# Patient Record
Sex: Female | Born: 1940 | Race: White | Hispanic: No | Marital: Married | Smoking: Never smoker
Health system: Southern US, Community
[De-identification: ages and names within clinical notes are randomized; demographics above are authoritative.]

## PROBLEM LIST (undated history)

## (undated) DIAGNOSIS — I10 Essential (primary) hypertension: Secondary | ICD-10-CM

## (undated) DIAGNOSIS — Z96652 Presence of left artificial knee joint: Secondary | ICD-10-CM

## (undated) DIAGNOSIS — E079 Disorder of thyroid, unspecified: Secondary | ICD-10-CM

## (undated) DIAGNOSIS — N840 Polyp of corpus uteri: Secondary | ICD-10-CM

## (undated) DIAGNOSIS — K219 Gastro-esophageal reflux disease without esophagitis: Secondary | ICD-10-CM

## (undated) DIAGNOSIS — J449 Chronic obstructive pulmonary disease, unspecified: Secondary | ICD-10-CM

## (undated) DIAGNOSIS — Z8719 Personal history of other diseases of the digestive system: Secondary | ICD-10-CM

## (undated) DIAGNOSIS — Z87898 Personal history of other specified conditions: Secondary | ICD-10-CM

## (undated) DIAGNOSIS — N289 Disorder of kidney and ureter, unspecified: Secondary | ICD-10-CM

## (undated) DIAGNOSIS — R7981 Abnormal blood-gas level: Secondary | ICD-10-CM

## (undated) DIAGNOSIS — R0600 Dyspnea, unspecified: Secondary | ICD-10-CM

## (undated) DIAGNOSIS — M1712 Unilateral primary osteoarthritis, left knee: Secondary | ICD-10-CM

## (undated) DIAGNOSIS — M1711 Unilateral primary osteoarthritis, right knee: Secondary | ICD-10-CM

## (undated) DIAGNOSIS — IMO0001 Reserved for inherently not codable concepts without codable children: Secondary | ICD-10-CM

## (undated) DIAGNOSIS — Z8489 Family history of other specified conditions: Secondary | ICD-10-CM

## (undated) DIAGNOSIS — E039 Hypothyroidism, unspecified: Secondary | ICD-10-CM

## (undated) HISTORY — PX: TONSILLECTOMY: SUR1361

## (undated) HISTORY — PX: JOINT REPLACEMENT: SHX530

## (undated) HISTORY — PX: EYE SURGERY: SHX253

## (undated) HISTORY — DX: Essential (primary) hypertension: I10

## (undated) HISTORY — DX: Dyspnea, unspecified: R06.00

## (undated) HISTORY — DX: Gastro-esophageal reflux disease without esophagitis: K21.9

## (undated) HISTORY — DX: Disorder of thyroid, unspecified: E07.9

## (undated) HISTORY — PX: TRIGGER FINGER RELEASE: SHX641

## (undated) HISTORY — DX: Polyp of corpus uteri: N84.0

## (undated) HISTORY — DX: Reserved for inherently not codable concepts without codable children: IMO0001

## (undated) HISTORY — DX: Chronic obstructive pulmonary disease, unspecified: J44.9

## (undated) HISTORY — PX: HERNIA REPAIR: SHX51

## (undated) HISTORY — DX: Morbid (severe) obesity due to excess calories: E66.01

---

## 1993-05-02 HISTORY — PX: KNEE ARTHROSCOPY: SUR90

## 1998-01-26 ENCOUNTER — Ambulatory Visit (HOSPITAL_BASED_OUTPATIENT_CLINIC_OR_DEPARTMENT_OTHER): Admission: RE | Admit: 1998-01-26 | Discharge: 1998-01-26 | Payer: Self-pay | Admitting: Orthopedic Surgery

## 1998-02-11 ENCOUNTER — Other Ambulatory Visit: Admission: RE | Admit: 1998-02-11 | Discharge: 1998-02-11 | Payer: Self-pay | Admitting: Family Medicine

## 1999-02-24 ENCOUNTER — Other Ambulatory Visit: Admission: RE | Admit: 1999-02-24 | Discharge: 1999-02-24 | Payer: Self-pay | Admitting: Family Medicine

## 1999-06-14 ENCOUNTER — Ambulatory Visit (HOSPITAL_COMMUNITY): Admission: RE | Admit: 1999-06-14 | Discharge: 1999-06-14 | Payer: Self-pay | Admitting: *Deleted

## 1999-08-20 ENCOUNTER — Encounter: Admission: RE | Admit: 1999-08-20 | Discharge: 1999-08-20 | Payer: Self-pay | Admitting: Family Medicine

## 1999-08-20 ENCOUNTER — Encounter: Payer: Self-pay | Admitting: Family Medicine

## 2000-03-31 ENCOUNTER — Other Ambulatory Visit: Admission: RE | Admit: 2000-03-31 | Discharge: 2000-03-31 | Payer: Self-pay | Admitting: *Deleted

## 2000-03-31 ENCOUNTER — Encounter (INDEPENDENT_AMBULATORY_CARE_PROVIDER_SITE_OTHER): Payer: Self-pay | Admitting: Specialist

## 2000-09-14 ENCOUNTER — Encounter: Payer: Self-pay | Admitting: Family Medicine

## 2000-09-14 ENCOUNTER — Encounter: Admission: RE | Admit: 2000-09-14 | Discharge: 2000-09-14 | Payer: Self-pay | Admitting: Family Medicine

## 2001-04-17 ENCOUNTER — Other Ambulatory Visit: Admission: RE | Admit: 2001-04-17 | Discharge: 2001-04-17 | Payer: Self-pay | Admitting: *Deleted

## 2001-09-17 ENCOUNTER — Encounter: Payer: Self-pay | Admitting: Family Medicine

## 2001-09-17 ENCOUNTER — Encounter: Admission: RE | Admit: 2001-09-17 | Discharge: 2001-09-17 | Payer: Self-pay | Admitting: Family Medicine

## 2002-06-04 ENCOUNTER — Other Ambulatory Visit: Admission: RE | Admit: 2002-06-04 | Discharge: 2002-06-04 | Payer: Self-pay | Admitting: *Deleted

## 2002-09-18 ENCOUNTER — Ambulatory Visit (HOSPITAL_COMMUNITY): Admission: RE | Admit: 2002-09-18 | Discharge: 2002-09-18 | Payer: Self-pay

## 2003-07-17 ENCOUNTER — Other Ambulatory Visit: Admission: RE | Admit: 2003-07-17 | Discharge: 2003-07-17 | Payer: Self-pay | Admitting: Obstetrics and Gynecology

## 2003-07-17 ENCOUNTER — Encounter: Admission: RE | Admit: 2003-07-17 | Discharge: 2003-07-17 | Payer: Self-pay | Admitting: Family Medicine

## 2004-07-21 ENCOUNTER — Other Ambulatory Visit: Admission: RE | Admit: 2004-07-21 | Discharge: 2004-07-21 | Payer: Self-pay | Admitting: Obstetrics and Gynecology

## 2005-01-25 ENCOUNTER — Encounter: Admission: RE | Admit: 2005-01-25 | Discharge: 2005-01-25 | Payer: Self-pay | Admitting: Family Medicine

## 2005-07-26 ENCOUNTER — Other Ambulatory Visit: Admission: RE | Admit: 2005-07-26 | Discharge: 2005-07-26 | Payer: Self-pay | Admitting: Obstetrics and Gynecology

## 2005-07-31 DIAGNOSIS — N840 Polyp of corpus uteri: Secondary | ICD-10-CM

## 2005-07-31 HISTORY — DX: Polyp of corpus uteri: N84.0

## 2005-08-25 ENCOUNTER — Ambulatory Visit (HOSPITAL_BASED_OUTPATIENT_CLINIC_OR_DEPARTMENT_OTHER): Admission: RE | Admit: 2005-08-25 | Discharge: 2005-08-25 | Payer: Self-pay | Admitting: Obstetrics and Gynecology

## 2005-08-25 ENCOUNTER — Encounter (INDEPENDENT_AMBULATORY_CARE_PROVIDER_SITE_OTHER): Payer: Self-pay | Admitting: *Deleted

## 2005-10-30 HISTORY — PX: HYSTEROSCOPY: SHX211

## 2006-03-06 ENCOUNTER — Encounter: Admission: RE | Admit: 2006-03-06 | Discharge: 2006-03-06 | Payer: Self-pay | Admitting: Obstetrics and Gynecology

## 2007-03-19 ENCOUNTER — Encounter: Admission: RE | Admit: 2007-03-19 | Discharge: 2007-03-19 | Payer: Self-pay | Admitting: Obstetrics and Gynecology

## 2007-03-27 ENCOUNTER — Other Ambulatory Visit: Admission: RE | Admit: 2007-03-27 | Discharge: 2007-03-27 | Payer: Self-pay | Admitting: Obstetrics and Gynecology

## 2008-06-02 ENCOUNTER — Encounter: Payer: Self-pay | Admitting: Obstetrics and Gynecology

## 2008-06-02 ENCOUNTER — Ambulatory Visit: Payer: Self-pay | Admitting: Obstetrics and Gynecology

## 2008-06-02 ENCOUNTER — Other Ambulatory Visit: Admission: RE | Admit: 2008-06-02 | Discharge: 2008-06-02 | Payer: Self-pay | Admitting: Obstetrics and Gynecology

## 2008-06-05 ENCOUNTER — Ambulatory Visit: Payer: Self-pay | Admitting: Obstetrics and Gynecology

## 2008-06-23 ENCOUNTER — Ambulatory Visit: Payer: Self-pay | Admitting: Obstetrics and Gynecology

## 2008-10-20 ENCOUNTER — Ambulatory Visit: Payer: Self-pay | Admitting: Obstetrics and Gynecology

## 2009-03-12 ENCOUNTER — Telehealth: Payer: Self-pay | Admitting: Gastroenterology

## 2009-08-17 ENCOUNTER — Encounter: Admission: RE | Admit: 2009-08-17 | Discharge: 2009-08-17 | Payer: Self-pay | Admitting: Obstetrics and Gynecology

## 2009-09-10 ENCOUNTER — Other Ambulatory Visit: Admission: RE | Admit: 2009-09-10 | Discharge: 2009-09-10 | Payer: Self-pay | Admitting: Obstetrics and Gynecology

## 2009-09-10 ENCOUNTER — Ambulatory Visit: Payer: Self-pay | Admitting: Obstetrics and Gynecology

## 2010-09-10 ENCOUNTER — Encounter (INDEPENDENT_AMBULATORY_CARE_PROVIDER_SITE_OTHER): Payer: Self-pay | Admitting: General Surgery

## 2010-09-17 NOTE — Op Note (Signed)
Valerie Beck, Valerie Beck                          ACCOUNT NO.:  [id number]   MEDICAL RECORD NO.:  [id number]                   PATIENT TYPE:  AMB   LOCATION:  DAY                                  FACILITY:  [facility]   PHYSICIAN:  Lorre Munroe., M.D.            DATE OF BIRTH:  May 11, 1940   DATE OF PROCEDURE:  09/18/2002  DATE OF DISCHARGE:                                 OPERATIVE REPORT   PREOPERATIVE DIAGNOSIS:  Incisional hernia.   POSTOPERATIVE DIAGNOSIS:  Incisional hernia.   OPERATION/PROCEDURE:  Repair of incisional hernia.   SURGEON:  Lebron Conners, M.D.   ANESTHESIA:  General.   DESCRIPTION OF PROCEDURE:  After the patient was monitored and anesthetized  with endotracheal intubation and routine preparation and draping of the  abdomen, I excised the upper midline incision and discarded the scar.  I  dissected down through the subcutaneous tissues until I encountered an  obvious hernia containing fat.  I freed up the normal subcutaneous fat from  the entire length of the incision and found that in total there were three  small hernia defects in the wound.  I enlarged the defects and freed up  adhesions enough to reduce the preperitoneal fat which was in them.  I hoped  to place mesh in the preperitoneal space but the dissection under the fascia  was somewhat difficult and I could not be absolutely certain that it was an  omentum which I saw rather than preperitoneal fat, so I decided that I would  not attempt reinforcement of fascia underneath it but rather on top of it.  I freed up the fascia for approximately 3-4 cm laterally on each side and a  similar distance past the ends of the incision.  I closed the defects with 3-  0 Prolene suture and then fashioned a patch of polypropylene mesh to fit the  dissected defect.  I sewed that in with a running, basting 2-0 Prolene  suture through-and-through the mesh not going out as far as the edges.  At  that point I felt the  hernia was well repaired.  I thoroughly anesthetized  the area with long-acting local anesthetic and made sure hemostasis was  good.  I closed the subcutaneous tissues with running 3-0 Vicryl after first  bringing out a closed suction drain through an inferolateral stab incision  and suturing that to the skin.  I stapled the skin.  The patient was stable  through the procedure.                                               Lorre Munroe., M.D.    WB/MEDQ  D:  09/18/2002  T:  09/18/2002  Job:  [id number]   cc:   Windle Guard, M.D.  [phone]  [address]  [place], [place] [address]  Fax: [phone]

## 2010-09-17 NOTE — Op Note (Signed)
NAMEJOLIN, Valerie Beck                ACCOUNT NO.:  [id number]   MEDICAL RECORD NO.:  [id number]          PATIENT TYPE:  AMB   LOCATION:  [facility]                         FACILITY:  [facility]   PHYSICIAN:  Daniel L. Gottsegen, M.D.DATE OF BIRTH:  [birth year]   DATE OF PROCEDURE:  08/25/2005  DATE OF DISCHARGE:                                 OPERATIVE REPORT   PREOPERATIVE DIAGNOSIS:  Endometrial cavity lesion.   POSTOPERATIVE DIAGNOSIS:  Probable endometrial polyp.   NAME OF OPERATION:  Hysteroscopy D&C with excision of endometrial polyp.   SURGEON:  Dr. Eda Paschal.   ANESTHESIA:  General.   INDICATIONS:  The patient is a 70 year old, gravida 3, para 2, AB 1 who came  to see me in the office with pelvic pain. As a result of this, an ultrasound  was done. The patient had two intramural myomas on ultrasound, but in  addition, she had a very enlarged endometrial cavity of 2+ cm with what  appeared to be a large mass. It was probably most consistent with  endometrial polyp.  The patient had not had any postmenopausal bleeding, but  it was felt that this needed to be assessed. She enters the hospital now for  the above.   FINDINGS:  External is normal. BUS is normal. Vaginal is normal.  Cervix is  clean.  Uterus is top normal size and shape with first-degree uterine  descensus.  Adnexa failed to reveal masses. At the time of hysteroscopy,  patient had a large intracavitary lesion which appeared most consistent with  an endometrial polyp.  It was attached to the anterior wall of the fundus  near the top. Once it had been completely excised, the patient had a  completely normal endometrial cavity without any other disease.   PROCEDURE:  After adequate general anesthesia, the patient was placed in the  dorsal supine position, prepped and draped in usual sterile manner.  A  single-toothed tenaculum was placed in the anterior lip of cervix, and the  cervix was dilated to #31 Pratt dilator.  Hysteroscopic resectoscope was then  introduced. It was attached to a camera for magnification. Three percent  sorbitol was used to expand the intrauterine cavity. A wire loop set at 90  degrees with appropriate cutting and coag settings for the new Bovie was  placed into the cavity, and the above was noted. Some of it could be  partially resected, but it was so large that it was difficult to get all the  way around it. As a result of this, a polyp forceps was used, and large  pieces of it were removed. Hysteroscope was then reinserted, and now, we  could completely excise the lesion without difficulty.  At the termination  of procedure, there was absolutely no bleeding.  There appeared almost be a  submucous myoma posteriorly that was sort of pushing towards the endometrial  cavity,  but it was clearly completely outside the endometrial cavity. Fluid deficit  for the entire procedure was 50 cc. Blood loss was minimal.  The patient  tolerated procedure well and left the operating room in  satisfactory  condition.  The tissue that was removed was sent to pathology for tissue  diagnosis.      Daniel L. Eda Paschal, M.D.  Electronically Signed     DLG/MEDQ  D:  08/25/2005  T:  08/25/2005  Job:  [id number]

## 2010-09-17 NOTE — Procedures (Signed)
[facility]  Patient:    Valerie Beck, Valerie Beck                       MRN: [id number] Proc. Date: 06/14/99 Adm. Date:  [id number] Attending:  Mingo Amber CC:         Hadassah Pais. Jeannetta Nap, M.D.                           Procedure Report  PROCEDURES:  Video colonoscopy.  INDICATIONS:  A 70 year old female with hematochezia.  Preprocedure hemoglobin as 13.6 however.  PREPARATION:  She was n.p.o. since midnight, having taken Phospho-Soda prep and a clear liquid diet.  The mucosa throughout was clean.  Depth of insertion: cecum.  PREPROCEDURE SEDATION:  She received a total of 100 mg of Demerol and 5 mg of Versed intravenously. In addition, she was on 2 L of nasal cannula O2.  PROCEDURE:  The Olympus video colonoscope was inserted via the rectum and advanced fairly easily all the way through the colon to the cecum.  Extraabdominal pressure was required to traverse the hepatic flexure.  Cecal landmarks were identified nd photographed.  On withdrawal, the mucosa was carefully evaluated.  The entire right colon was normal.  Within the sigmoid colon were a minor number of wide-mouthed  diverticuli.  Retroflexed view of the rectum was unremarkable.  Patient has obvious external hemorrhoids on withdrawal through the anal canal however.  She tolerated the procedure well.  Pulse, blood pressure and oximetry testing were stable throughout.  Patient was observed in recovery for 45 minutes and discharged home alert with a benign abdomen.  IMPRESSION:  Near normal colonoscopy with few left-sided diverticuli and external hemorrhoids.  PLAN:  She is to return to the care of Dr. Jeannetta Nap.  I would be glad to reevaluate if there is any need.  She is given brochures on diverticulosis and hemorrhoidal care. DD:  06/14/99 TD:  06/14/99 Job: [id number]

## 2011-08-09 ENCOUNTER — Other Ambulatory Visit: Payer: Self-pay | Admitting: Family Medicine

## 2011-08-09 DIAGNOSIS — Z1231 Encounter for screening mammogram for malignant neoplasm of breast: Secondary | ICD-10-CM

## 2011-08-18 DIAGNOSIS — N840 Polyp of corpus uteri: Secondary | ICD-10-CM | POA: Insufficient documentation

## 2011-08-25 ENCOUNTER — Ambulatory Visit
Admission: RE | Admit: 2011-08-25 | Discharge: 2011-08-25 | Disposition: A | Discharge status: Home / Self Care | Payer: Medicare Other | Source: Ambulatory Visit | Attending: Family Medicine | Admitting: Family Medicine

## 2011-08-25 DIAGNOSIS — Z1231 Encounter for screening mammogram for malignant neoplasm of breast: Secondary | ICD-10-CM

## 2011-08-29 ENCOUNTER — Encounter: Payer: Self-pay | Admitting: Obstetrics and Gynecology

## 2011-08-29 ENCOUNTER — Ambulatory Visit (INDEPENDENT_AMBULATORY_CARE_PROVIDER_SITE_OTHER): Payer: Medicare Other | Admitting: Obstetrics and Gynecology

## 2011-08-29 VITALS — BP 120/80 | Ht 62.5 in | Wt 240.0 lb

## 2011-08-29 DIAGNOSIS — E079 Disorder of thyroid, unspecified: Secondary | ICD-10-CM | POA: Insufficient documentation

## 2011-08-29 DIAGNOSIS — R350 Frequency of micturition: Secondary | ICD-10-CM

## 2011-08-29 DIAGNOSIS — N393 Stress incontinence (female) (male): Secondary | ICD-10-CM

## 2011-08-29 DIAGNOSIS — N952 Postmenopausal atrophic vaginitis: Secondary | ICD-10-CM

## 2011-08-29 DIAGNOSIS — I10 Essential (primary) hypertension: Secondary | ICD-10-CM | POA: Insufficient documentation

## 2011-08-29 DIAGNOSIS — R3915 Urgency of urination: Secondary | ICD-10-CM

## 2011-08-29 NOTE — Progress Notes (Signed)
Patient came back to see me today for further followup. She is having urgency and stress incontinence. She also gets nocturia. She has no dysuria or hematuria. She is having no vaginal bleeding. She does have atrophic vaginitis but is asymptomatic. She just had her mammogram which was normal. She is having no pelvic pain. She has had 2 normal bone densities.she does her lab through PCP.  ROS: 12 system review done. Pertinent positives above. Other positives include hyperlipidemia, hypertension, GERD, and hypothyroidism.  Physical examination: Kennon Portela present. HEENT within normal limits. Neck: Thyroid not large. No masses. Supraclavicular nodes: not enlarged. Breasts: Examined in both sitting and lying  position. No skin changes and no masses. Abdomen: Soft no guarding rebound or masses or hernia. Pelvic: External: Within normal limits. BUS: Within normal limits. Vaginal:within normal limits. Poor  estrogen effect. No evidence of cystocele rectocele or enterocele. Cervix: clean. Uterus: Normal size and shape. Adnexa: No masses. Rectovaginal exam: Confirmatory and negative. Extremities: Within normal limits.  Assessment: #1. Urinary frequency #2. Urinary urgency #3. Atrophic vaginitis #4. 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 She will do Kegel exercises. She will call when she's ready for surgery. She will continue yearly mammograms.

## 2011-08-30 LAB — URINALYSIS W MICROSCOPIC + REFLEX CULTURE
Bacteria, UA: NONE SEEN
Crystals: NONE SEEN
Glucose, UA: NEGATIVE mg/dL
Specific Gravity, Urine: 1.008 (ref 1.005–1.030)
pH: 6 (ref 5.0–8.0)

## 2012-09-21 ENCOUNTER — Other Ambulatory Visit (HOSPITAL_COMMUNITY): Payer: Self-pay | Admitting: Family Medicine

## 2012-09-21 DIAGNOSIS — M79609 Pain in unspecified limb: Secondary | ICD-10-CM

## 2012-09-28 ENCOUNTER — Ambulatory Visit (HOSPITAL_COMMUNITY)
Admission: RE | Admit: 2012-09-28 | Discharge: 2012-09-28 | Disposition: A | Discharge status: Home / Self Care | Payer: Medicare Other | Source: Ambulatory Visit | Attending: Cardiovascular Disease | Admitting: Cardiovascular Disease

## 2012-09-28 DIAGNOSIS — I70219 Atherosclerosis of native arteries of extremities with intermittent claudication, unspecified extremity: Secondary | ICD-10-CM

## 2012-09-28 DIAGNOSIS — M79609 Pain in unspecified limb: Secondary | ICD-10-CM | POA: Insufficient documentation

## 2012-09-28 NOTE — Progress Notes (Signed)
ABI Completed. Normal. Valerie Beck

## 2013-01-29 ENCOUNTER — Other Ambulatory Visit: Payer: Self-pay | Admitting: Family Medicine

## 2013-01-29 DIAGNOSIS — R221 Localized swelling, mass and lump, neck: Secondary | ICD-10-CM

## 2013-01-30 ENCOUNTER — Other Ambulatory Visit: Payer: Medicare Other

## 2013-01-31 ENCOUNTER — Ambulatory Visit
Admission: RE | Admit: 2013-01-31 | Discharge: 2013-01-31 | Disposition: A | Discharge status: Home / Self Care | Payer: Medicare Other | Source: Ambulatory Visit | Attending: Family Medicine | Admitting: Family Medicine

## 2013-01-31 DIAGNOSIS — R221 Localized swelling, mass and lump, neck: Secondary | ICD-10-CM

## 2013-02-11 ENCOUNTER — Ambulatory Visit (HOSPITAL_COMMUNITY): Payer: Medicare Other | Attending: Family Medicine | Admitting: Cardiology

## 2013-02-11 ENCOUNTER — Other Ambulatory Visit (HOSPITAL_COMMUNITY): Payer: Self-pay | Admitting: Family Medicine

## 2013-02-11 DIAGNOSIS — R0609 Other forms of dyspnea: Secondary | ICD-10-CM | POA: Insufficient documentation

## 2013-02-11 DIAGNOSIS — R0989 Other specified symptoms and signs involving the circulatory and respiratory systems: Secondary | ICD-10-CM | POA: Insufficient documentation

## 2013-02-11 NOTE — Progress Notes (Signed)
Echo performed. 

## 2013-03-25 ENCOUNTER — Encounter (INDEPENDENT_AMBULATORY_CARE_PROVIDER_SITE_OTHER): Payer: Self-pay

## 2013-03-25 ENCOUNTER — Ambulatory Visit (INDEPENDENT_AMBULATORY_CARE_PROVIDER_SITE_OTHER): Payer: Medicare Other | Admitting: Cardiology

## 2013-03-25 ENCOUNTER — Encounter: Payer: Self-pay | Admitting: Cardiology

## 2013-03-25 VITALS — BP 144/86 | HR 66 | Ht 62.5 in | Wt 248.0 lb

## 2013-03-25 DIAGNOSIS — R0609 Other forms of dyspnea: Secondary | ICD-10-CM

## 2013-03-25 DIAGNOSIS — R0600 Dyspnea, unspecified: Secondary | ICD-10-CM

## 2013-03-25 DIAGNOSIS — I10 Essential (primary) hypertension: Secondary | ICD-10-CM

## 2013-03-25 HISTORY — DX: Dyspnea, unspecified: R06.00

## 2013-03-25 HISTORY — DX: Morbid (severe) obesity due to excess calories: E66.01

## 2013-03-25 NOTE — Progress Notes (Signed)
Valerie Beck Date of Birth: March 21, 1941 Medical Record #[id number]  History of Present Illness: Valerie Beck to is seen at the request of Dr. Jeannetta Nap for evaluation of dyspnea. She is a pleasant 72 year old white female with history of hypertension, hyperlipidemia, and morbid obesity. Her major complaint is of shortness of breath with exertion. She states she can no longer do much of anything without getting out of breath. Sometimes she'll have symptoms of breathlessness at rest. She did note some increased ankle swelling left greater than right. Her Lasix dose was doubled and this has helped. She reports that her weight has steadily been increasing over the past year despite no change in her diet. She denies any significant chest pain. She was evaluated by myself many years ago but cannot recall what that evaluation in detail. I have no records currently. She denies any significant cough but sometimes feels a knot in her throat. She did have recent thyroid ultrasound which was unremarkable. According to notes from Dr. Jeannetta Nap her chest x-ray showed cardiac enlargement without other acute changes. BNP level was 278. Hemoglobin was 12.4. Creatinine 1.26. TSH 2.99. Oxygen saturations have been normal.  Current Outpatient Prescriptions on File Prior to Visit  Medication Sig Dispense Refill  . aspirin 325 MG tablet Take 325 mg by mouth daily.      Marland Kitchen levothyroxine (SYNTHROID, LEVOTHROID) 88 MCG tablet Take 88 mcg by mouth as directed.       . loratadine (CLARITIN) 10 MG tablet Take 10 mg by mouth daily.      . metoprolol (LOPRESSOR) 50 MG tablet Take 100 mg by mouth every evening.       Marland Kitchen omeprazole (PRILOSEC) 20 MG capsule Take 20 mg by mouth daily.      . pravastatin (PRAVACHOL) 40 MG tablet Take 40 mg by mouth daily.       No current facility-administered medications on file prior to visit.    Allergies  Allergen Reactions  . Penicillins     Past Medical History  Diagnosis Date  . Endometrial  polyp 07/2005    HYSTEROSCOPY, D&C  . Hypertension   . Reflux   . Thyroid disease     Hypothyroid  . Urinary incontinence   . Dyspnea 03/25/2013  . Morbid obesity 03/25/2013    Past Surgical History  Procedure Laterality Date  . Hernia repair      X 2  . Trigger finger release      LEFT THEN RIGHT IN 2012  . Knee arthroscopy  1995  . Hysteroscopy  10/2005    D&C, HYSTEROSCOPY FOR ENDOMETRIAL POLYPS.    History  Smoking status  . Never Smoker   Smokeless tobacco  . Not on file    History  Alcohol Use No    Family History  Problem Relation Age of Onset  . Cancer Father     MELANOMA AND THYROID CANCER  . Diabetes Father   . Hypertension Father   . Heart disease Father   . Diabetes Sister   . Hypertension Mother   . Hypertension Brother   . Heart disease Brother   . Cancer Brother     Lymphoma    Review of Systems: The review of systems is positive for morbid obesity. She denies any difficulty sleeping and has no history of snoring. She does not have significant fatigue. she does feel occasional skipped beats in her heart. All other systems were reviewed and are negative.  Physical Exam: BP 144/86  Pulse  66  Ht 5' 2.5" (1.588 m)  Wt 248 lb (112.492 kg)  BMI 44.61 kg/m2 She is a morbidly obese white female in no acute distress. HEENT: Normocephalic, atraumatic. Pupils are equal round and reactive to light and accommodation. Oropharynx is clear. Neck: No adenopathy, thyromegaly, bruits, or JVD. Lungs: Clear Chest: Pendulous breasts. Cardiovascular: Regular rate and rhythm. Normal S1 and S2. No gallop, murmur, or click. Abdomen: Soft, obese, and nontender. No masses or hepatosplenomegaly. Extremities: No cyanosis or edema. Pulses are 2+ and symmetric. Skin: Warm and dry Neuro: Alert and oriented x3. Cranial nerves II through XII are intact.  LABORATORY DATA: ECG today demonstrates normal sinus rhythm with occasional PACs. It is otherwise  normal.  Echo:Study Conclusions  - Left ventricle: The cavity size was normal. Wall thickness was normal. Systolic function was normal. The estimated ejection fraction was in the range of 55% to 65%. Wall motion was normal; there were no regional wall motion abnormalities. - Left atrium: The atrium was mildly dilated. - Pulmonary arteries: Systolic pressure was mildly increased. PA peak pressure: 41mm Hg (S).   Assessment / Plan: 1. Dyspnea on exertion. I think her symptoms are most likely multifactorial. I think she has a component of diastolic heart failure which improved with increased diuretic dose. I think she also has a component of restrictive lung disease related to her morbid obesity. She also is deconditioned.she has mild pulmonary hypertension. I would like to make sure she is not having coronary ischemia presenting in an atypical fashion. I'll schedule her for a lexiscan Myoview study. I will also obtain pulmonary function studies. Her history does not suggest obstructive sleep apnea but she is at increased risk.We will continue with her higher dose of Lasix. I think that she would need to lose a significant amount of weight and will need regular aerobic exercise. We discussed weight loss strategies. 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  2. Morbid obesity.  3. Hypertension-controlled.  4. Hyperlipidemia on statin therapy.

## 2013-03-25 NOTE — Patient Instructions (Signed)
We will schedule you for a nuclear stress test and pulmonary function study  Continue your current therapy  Focus on weight loss.

## 2013-04-11 ENCOUNTER — Ambulatory Visit (HOSPITAL_COMMUNITY): Payer: Medicare Other | Attending: Cardiology | Admitting: Radiology

## 2013-04-11 VITALS — BP 138/66 | Ht 62.0 in | Wt 250.0 lb

## 2013-04-11 DIAGNOSIS — R0609 Other forms of dyspnea: Secondary | ICD-10-CM | POA: Insufficient documentation

## 2013-04-11 DIAGNOSIS — R0602 Shortness of breath: Secondary | ICD-10-CM

## 2013-04-11 DIAGNOSIS — Z8249 Family history of ischemic heart disease and other diseases of the circulatory system: Secondary | ICD-10-CM | POA: Insufficient documentation

## 2013-04-11 DIAGNOSIS — I10 Essential (primary) hypertension: Secondary | ICD-10-CM

## 2013-04-11 DIAGNOSIS — R0600 Dyspnea, unspecified: Secondary | ICD-10-CM

## 2013-04-11 DIAGNOSIS — E785 Hyperlipidemia, unspecified: Secondary | ICD-10-CM | POA: Insufficient documentation

## 2013-04-11 DIAGNOSIS — R0989 Other specified symptoms and signs involving the circulatory and respiratory systems: Secondary | ICD-10-CM | POA: Insufficient documentation

## 2013-04-11 DIAGNOSIS — R002 Palpitations: Secondary | ICD-10-CM | POA: Insufficient documentation

## 2013-04-11 MED ORDER — TECHNETIUM TC 99M SESTAMIBI GENERIC - CARDIOLITE
33.0000 | Freq: Once | INTRAVENOUS | Status: AC | PRN
  Administered 2013-04-11: 33 via INTRAVENOUS

## 2013-04-11 MED ORDER — REGADENOSON 0.4 MG/5ML IV SOLN
0.4000 mg | Freq: Once | INTRAVENOUS | Status: AC
  Administered 2013-04-11: 0.4 mg via INTRAVENOUS

## 2013-04-11 NOTE — Progress Notes (Signed)
[facility] SITE 3 NUCLEAR MED [address] [place], [place] [address] [phone]    Cardiology Nuclear Med Study  Valerie Beck is a 72 y.o. female     MRN : [id number]     DOB: 07-07-1940  Procedure Date: 04/11/2013  Nuclear Med Background Indication for Stress Test:  Evaluation for Ischemia History: Echo:02/11/13 EF:55-65%,[facility] Nuclear Study 09' (no report)Nml Cardiac Risk Factors: Family History - CAD, Hypertension and Lipids  Symptoms:  DOE, Palpitations and SOB   Nuclear Pre-Procedure Caffeine/Decaff Intake:  None NPO After: 7:00pm   Lungs:  clear O2 Sat: 97% on room air. IV 0.9% NS with Angio Cath:  22g  IV Site: R Antecubital  IV Started by:  Bonnita Levan, RN  Chest Size (in):  44 Cup Size: C+  Height: 5\' 2"  (1.575 m)  Weight:  250 lb (113.399 kg)  BMI:  Body mass index is 45.71 kg/(m^2). Tech Comments:  N/A    Nuclear Med Study 1 or 2 day study: 2 day  Stress Test Type:  Lexiscan  Reading MD: P. Nahser,M.D. Order Authorizing Provider:  Peter Swaziland, MD  Resting Radionuclide: Technetium 76m Sestamibi  Resting Radionuclide Dose: 33.0 mCi  On      04-15-13  Stress Radionuclide:  Technetium 42m Sestamibi  Stress Radionuclide Dose: 33.0 mCi   On        04-11-13          Stress Protocol Rest HR: 75 Stress HR: 96  Rest BP: 138/66 Stress BP: 146/67  Exercise Time (min): n/a METS: n/a   Predicted Max HR: 150 bpm % Max HR: 64 bpm Rate Pressure Product: 86578   Dose of Adenosine (mg):  n/a Dose of Lexiscan: 0.4 mg  Dose of Atropine (mg): n/a Dose of Dobutamine: n/a mcg/kg/min (at max HR)  Stress Test Technologist: Frederick Peers, EMT-P  Nuclear Technologist:  Domenic Polite, CNMT     Rest Procedure:  Myocardial perfusion imaging was performed at rest 45 minutes following the intravenous administration of Technetium 7m Sestamibi. Rest ECG: NSR - Normal EKG  Stress Procedure:  The patient received IV Lexiscan 0.4 mg over 15-seconds.  Technetium  3m Sestamibi injected at 30-seconds.  Quantitative spect images were obtained after a 45 minute delay. Stress ECG: No significant change from baseline ECG  QPS Raw Data Images:  Normal; no motion artifact; normal heart/lung ratio. Stress Images:  Normal homogeneous uptake in all areas of the myocardium. Rest Images:  Normal homogeneous uptake in all areas of the myocardium. Subtraction (SDS):  No evidence of ischemia. Transient Ischemic Dilatation (Normal <1.22):  0.86 Lung/Heart Ratio (Normal <0.45):  0.27 92 Quantitative Gated Spect Images QGS EDV:  NA QGS ESV:  NA  Impression Exercise Capacity:  Lexiscan with no exercise. BP Response:  Normal blood pressure response. Clinical Symptoms:  No significant symptoms noted. ECG Impression:  No significant ST segment change suggestive of ischemia. Comparison with Prior Nuclear Study: No images to compare  Overall Impression:  Normal stress nuclear study.  LV Ejection Fraction: Study not gated.  LV Wall Motion:NA   Vesta Mixer, Montez Hageman., MD, [facility] 04/15/2013, 4:58 PM Office - [phone] Pager [phone]

## 2013-04-15 ENCOUNTER — Ambulatory Visit (HOSPITAL_COMMUNITY): Payer: Medicare Other | Attending: Cardiovascular Disease

## 2013-04-15 DIAGNOSIS — R0989 Other specified symptoms and signs involving the circulatory and respiratory systems: Secondary | ICD-10-CM

## 2013-04-15 MED ORDER — TECHNETIUM TC 99M SESTAMIBI GENERIC - CARDIOLITE
33.0000 | Freq: Once | INTRAVENOUS | Status: AC | PRN
  Administered 2013-04-15: 33 via INTRAVENOUS

## 2013-04-26 ENCOUNTER — Encounter (INDEPENDENT_AMBULATORY_CARE_PROVIDER_SITE_OTHER): Payer: Self-pay

## 2013-04-26 ENCOUNTER — Ambulatory Visit (INDEPENDENT_AMBULATORY_CARE_PROVIDER_SITE_OTHER): Payer: Medicare Other | Admitting: Internal Medicine

## 2013-04-26 DIAGNOSIS — I10 Essential (primary) hypertension: Secondary | ICD-10-CM

## 2013-04-26 DIAGNOSIS — R0600 Dyspnea, unspecified: Secondary | ICD-10-CM

## 2013-04-26 DIAGNOSIS — R0602 Shortness of breath: Secondary | ICD-10-CM

## 2013-04-26 LAB — PULMONARY FUNCTION TEST
DL/VA: 4.87 ml/min/mmHg/L
DLCO unc % pred: 90 %
FEF 25-75 Pre: 1.03 L/sec
FEF2575-%Pred-Pre: 58 %
FEV1-%Change-Post: 10 %
FEV1-%Pred-Pre: 66 %
FEV1-Pre: 1.38 L
FEV1FVC-%Change-Post: 7 %
FEV1FVC-%Pred-Pre: 97 %
FEV6-%Change-Post: 4 %
FEV6-%Pred-Post: 73 %
FEV6-Post: 1.91 L
FEV6-Pre: 1.84 L
FVC-%Pred-Pre: 67 %
FVC-Post: 1.91 L
FVC-Pre: 1.86 L
Post FEV1/FVC ratio: 80 %
Pre FEV1/FVC ratio: 74 %
Pre FEV6/FVC Ratio: 99 %
RV % pred: 97 %
RV: 2.03 L

## 2013-04-26 NOTE — Progress Notes (Signed)
PFT done today. 

## 2013-05-01 ENCOUNTER — Other Ambulatory Visit: Payer: Self-pay

## 2013-05-01 MED ORDER — TIOTROPIUM BROMIDE MONOHYDRATE 18 MCG IN CAPS: 18.0000 ug | ORAL_CAPSULE | Freq: Every day | RESPIRATORY_TRACT | Status: DC

## 2014-03-03 ENCOUNTER — Encounter: Payer: Self-pay | Admitting: Cardiology

## 2014-03-25 ENCOUNTER — Other Ambulatory Visit: Payer: Self-pay

## 2014-03-25 DIAGNOSIS — Z1231 Encounter for screening mammogram for malignant neoplasm of breast: Secondary | ICD-10-CM

## 2014-04-01 ENCOUNTER — Ambulatory Visit
Admission: RE | Admit: 2014-04-01 | Discharge: 2014-04-01 | Disposition: A | Discharge status: Home / Self Care | Payer: Medicare Other | Source: Ambulatory Visit

## 2014-04-01 DIAGNOSIS — Z1231 Encounter for screening mammogram for malignant neoplasm of breast: Secondary | ICD-10-CM

## 2014-04-15 ENCOUNTER — Telehealth: Payer: Self-pay

## 2014-04-15 ENCOUNTER — Other Ambulatory Visit: Payer: Self-pay | Admitting: Gastroenterology

## 2014-04-15 NOTE — Telephone Encounter (Signed)
[facility]Dr.Jordan cleared patient for upcoming surgery.Form faxed back to fax # [phone].

## 2014-04-29 ENCOUNTER — Encounter: Payer: Self-pay | Admitting: Cardiology

## 2014-04-29 ENCOUNTER — Ambulatory Visit (INDEPENDENT_AMBULATORY_CARE_PROVIDER_SITE_OTHER): Payer: Medicare Other | Admitting: Cardiology

## 2014-04-29 ENCOUNTER — Telehealth: Payer: Self-pay

## 2014-04-29 VITALS — BP 130/70 | HR 76 | Ht 64.0 in | Wt 245.0 lb

## 2014-04-29 DIAGNOSIS — I10 Essential (primary) hypertension: Secondary | ICD-10-CM

## 2014-04-29 DIAGNOSIS — J449 Chronic obstructive pulmonary disease, unspecified: Secondary | ICD-10-CM | POA: Insufficient documentation

## 2014-04-29 DIAGNOSIS — R0600 Dyspnea, unspecified: Secondary | ICD-10-CM

## 2014-04-29 NOTE — Progress Notes (Signed)
Valerie Beck Date of Beck: 1940-07-13 Medical Record #[id number]  History of Present Illness: [name] to is seen for cardiac clearance for TKR.  She has a history of hypertension, hyperlipidemia, and morbid obesity. She was seen one year ago for evaluation of dyspnea.  Echo showed mild LAE with normal LV function. Mild pulmonary HTN. Myoview study was normal. PFTs showed moderate COPD with some response to bronchodilators. She was started on Spiriva but didn't really notice much change. She does think her dyspnea is better this year. Minimal swelling. Mild congestion in the early am. 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   Current Outpatient Prescriptions on File Prior to Visit  Medication Sig Dispense Refill  . Cholecalciferol (VITAMIN D-3 PO) Take 5,000 Units by mouth.    . cyanocobalamin 500 MCG tablet Take 500 mcg by mouth daily.    Marland Kitchen. DOCUSATE SODIUM PO Take by mouth daily.    . furosemide (LASIX) 80 MG tablet Take 80 mg by mouth daily.    . Glucosamine-Chondroitin (GLUCOSAMINE CHONDR COMPLEX PO) Take by mouth.    . metoprolol (LOPRESSOR) 50 MG tablet Take 100 mg by mouth every evening.     . Multiple Vitamin (MULTIVITAMIN) tablet Take 1 tablet by mouth daily.    Marland Kitchen. omeprazole (PRILOSEC) 20 MG capsule Take 20 mg by mouth daily.    . pravastatin (PRAVACHOL) 40 MG tablet Take 40 mg by mouth daily.    Marland Kitchen. tiotropium (SPIRIVA HANDIHALER) 18 MCG inhalation capsule Place 1 capsule (18 mcg total) into inhaler and inhale daily. 30 capsule 6   No current facility-administered medications on file prior to visit.    Allergies  Allergen Reactions  . Penicillins     Past Medical History  Diagnosis Date  . Endometrial polyp 07/2005    HYSTEROSCOPY, D&C  . Hypertension   . Reflux   . Thyroid disease     Hypothyroid  . Urinary incontinence   . Dyspnea 03/25/2013  . Morbid obesity 03/25/2013  . COPD (chronic obstructive pulmonary disease)     Past Surgical  History  Procedure Laterality Date  . Hernia repair      X 2  . Trigger finger release      LEFT THEN RIGHT IN 2012  . Knee arthroscopy  1995  . Hysteroscopy  10/2005    D&C, HYSTEROSCOPY FOR ENDOMETRIAL POLYPS.    History  Smoking status  . Never Smoker   Smokeless tobacco  . Not on file    History  Alcohol Use No    Family History  Problem Relation Age of Onset  . Cancer Father     MELANOMA AND THYROID CANCER  . Diabetes Father   . Hypertension Father   . Heart disease Father   . Diabetes Sister   . Hypertension Mother   . Hypertension Brother   . Heart disease Brother   . Cancer Brother     Lymphoma    Review of Systems: The review of systems is positive for morbid obesity. All other systems were reviewed and are negative.  Physical Exam: BP 130/70 mmHg  Pulse 76  Ht 5\' 4"  (1.626 m)  Wt 245 lb (111.131 kg)  BMI 42.03 kg/m2 She is a morbidly obese white female in no acute distress. HEENT: Normocephalic, atraumatic. Pupils are equal round and reactive to light and accommodation. Oropharynx is clear. Neck: No adenopathy, thyromegaly, bruits, or JVD. Lungs: Clear Chest: Pendulous breasts. Cardiovascular: Regular rate and rhythm.  Normal S1 and S2. No gallop, murmur, or click. Abdomen: Soft, obese, and nontender. No masses or hepatosplenomegaly. Extremities: No cyanosis or edema. Pulses are 2+ and symmetric. Skin: Warm and dry Neuro: Alert and oriented x3. Cranial nerves II through XII are intact.  LABORATORY DATA: ECG today demonstrates normal sinus rhythm with rate 76. It is  Normal. I have personally reviewed and interpreted this study.   Echo:Study Conclusions  - Left ventricle: The cavity size was normal. Wall thickness was normal. Systolic function was normal. The estimated ejection fraction was in the range of 55% to 65%. Wall motion was normal; there were no regional wall motion abnormalities. - Left atrium: The atrium was mildly dilated. -  Pulmonary arteries: Systolic pressure was mildly increased. PA peak pressure: 41mm Hg (S).  Cardiology Nuclear Med Study  Valerie Beck is a 73 y.o. female MRN : [id number] DOB: 11/03/1940  Procedure Date: 04/11/2013  Nuclear Med Background Indication for Stress Test: Evaluation for Ischemia History: Echo:02/11/13 EF:55-65%,[facility] Nuclear Study 09' (no report)Nml Cardiac Risk Factors: Family History - CAD, Hypertension and Lipids  Symptoms: DOE, Palpitations and SOB   Nuclear Pre-Procedure Caffeine/Decaff Intake: None NPO After: 7:00pm   Lungs: clear O2 Sat: 97% on room air. IV 0.9% NS with Angio Cath: 22g  IV Site: R Antecubital  IV Started by: Valerie LevanJackie Smith, RN  Chest Size (in): 44 Cup Size: C+  Height: 5\' 2"  (1.575 m)  Weight: 250 lb (113.399 kg)  BMI: Body mass index is 45.71 kg/(m^2). Tech Comments: N/A    Nuclear Med Study 1 or 2 day study: 2 day  Stress Test Type: Lexiscan  Reading MD: Valerie Beck,M.D. Order Authorizing Provider: Peter SwazilandJordan, MD  Resting Radionuclide: Technetium 2214m Sestamibi  Resting Radionuclide Dose: 33.0 mCi On  04-15-13  Stress Radionuclide: Technetium 2514m Sestamibi  Stress Radionuclide Dose: 33.0 mCi On  04-11-13     Stress Protocol Rest HR: 75 Stress HR: 96  Rest BP: 138/66 Stress BP: 146/67  Exercise Time (min): n/a METS: n/a   Predicted Max HR: 150 bpm % Max HR: 64 bpm Rate Pressure Product: [id number]   Dose of Adenosine (mg): n/a Dose of Lexiscan: 0.4 mg  Dose of Atropine (mg): n/a Dose of Dobutamine: n/a mcg/kg/min (at max HR)  Stress Test Technologist: Valerie Peerseresa Beck, EMT-P  Nuclear Technologist: Valerie Beck, CNMT     Rest Procedure: Myocardial perfusion imaging was performed at rest 45 minutes following the intravenous administration of Technetium 4414m Sestamibi. Rest ECG: NSR - Normal EKG  Stress Procedure: The patient received IV Lexiscan 0.4 mg over  15-seconds. Technetium 414m Sestamibi injected at 30-seconds. Quantitative spect images were obtained after a 45 minute delay. Stress ECG: No significant change from baseline ECG  QPS Raw Data Images: Normal; no motion artifact; normal heart/lung ratio. Stress Images: Normal homogeneous uptake in all areas of the myocardium. Rest Images: Normal homogeneous uptake in all areas of the myocardium. Subtraction (SDS): No evidence of ischemia. Transient Ischemic Dilatation (Normal <1.22): 0.86 Lung/Heart Ratio (Normal <0.45): 0.27 92 Quantitative Gated Spect Images QGS EDV: NA QGS ESV: NA  Impression Exercise Capacity: Lexiscan with no exercise. BP Response: Normal blood pressure response. Clinical Symptoms: No significant symptoms noted. ECG Impression: No significant ST segment change suggestive of ischemia. Comparison with Prior Nuclear Study: No images to compare  Overall Impression: Normal stress nuclear study.  LV Ejection Fraction: Study not gated. LV Wall Motion:NA   Valerie Beck, Valerie HagemanJr., MD, [facility] Center[facility]ACC 04/15/2013, 4:58 PM Office -  [phone] Pager [phone]  Assessment / Plan: 1. Dyspnea-chronic. This is multifactorial. She has a component of diastolic heart failure which improved with diuretics. I think she also has a component of restrictive lung disease related to her morbid obesity. She  is deconditioned. She has mild pulmonary hypertension. She has evidence of COPD by PFTs.  At this point she is stable from a cardiac view. She is cleared for TKR. If breathing worsens it may be beneficial to have pulmonary see. I will follow up prn.  2. Morbid obesity.  3. Hypertension-controlled.  4. Hyperlipidemia on statin therapy.  5. COPD with bronchodilator response.

## 2014-04-29 NOTE — Telephone Encounter (Signed)
Dr.Jordan cleared patient for upcoming knee surgery.Note faxed to Dr.Wainer's office at fax # [phone].

## 2014-04-29 NOTE — Patient Instructions (Signed)
Continue your current therapy although you can stop Spiriva.  You are clear for knee surgery.

## 2014-05-21 ENCOUNTER — Other Ambulatory Visit (HOSPITAL_COMMUNITY): Payer: Medicare Other

## 2014-05-21 ENCOUNTER — Encounter (HOSPITAL_COMMUNITY): Payer: Self-pay | Admitting: Physician Assistant

## 2014-05-21 DIAGNOSIS — M1712 Unilateral primary osteoarthritis, left knee: Secondary | ICD-10-CM | POA: Diagnosis present

## 2014-05-21 NOTE — H&P (Signed)
TOTAL KNEE ADMISSION H&P  Patient is being admitted for left total knee arthroplasty.  Subjective:  Chief Complaint:left knee pain.  HPI: Valerie Beck, 74 y.o. female, has a history of pain and functional disability in the left knee due to arthritis and has failed non-surgical conservative treatments for greater than 12 weeks to includeNSAID's and/or analgesics, corticosteriod injections, viscosupplementation injections, flexibility and strengthening excercises, supervised PT with diminished ADL's post treatment, weight reduction as appropriate and activity modification.  Onset of symptoms was gradual, starting 10 years ago with gradually worsening course since that time. The patient noted prior procedures on the knee to include  arthroscopy and menisectomy on the left knee(s).  Patient currently rates pain in the left knee(s) at 10 out of 10 with activity. Patient has night pain, worsening of pain with activity and weight bearing, pain that interferes with activities of daily living, crepitus and joint swelling.  Patient has evidence of subchondral sclerosis, joint subluxation and joint space narrowing by imaging studies.  There is no active infection.  Patient Active Problem List   Diagnosis Date Noted  . Primary localized osteoarthritis of left knee   . COPD (chronic obstructive pulmonary disease)   . Dyspnea 03/25/2013  . Morbid obesity 03/25/2013  . Hypertension   . Reflux   . Thyroid disease   . Endometrial polyp    Past Medical History  Diagnosis Date  . Endometrial polyp 07/2005    HYSTEROSCOPY, D&C  . Hypertension   . Reflux   . Thyroid disease     Hypothyroid  . Urinary incontinence   . Dyspnea 03/25/2013  . Morbid obesity 03/25/2013  . COPD (chronic obstructive pulmonary disease)   . Primary localized osteoarthritis of left knee     Past Surgical History  Procedure Laterality Date  . Hernia repair      X 2  . Trigger finger release      LEFT THEN RIGHT IN 2012  .  Knee arthroscopy  1995  . Hysteroscopy  10/2005    D&C, HYSTEROSCOPY FOR ENDOMETRIAL POLYPS.    No current facility-administered medications for this encounter.  Current outpatient prescriptions:  .  aspirin 81 MG tablet, Take 81 mg by mouth daily., Disp: , Rfl:  .  Cholecalciferol (VITAMIN D-3 PO), Take 5,000 Units by mouth., Disp: , Rfl:  .  cyanocobalamin 500 MCG tablet, Take 500 mcg by mouth daily., Disp: , Rfl:  .  DOCUSATE SODIUM PO, Take by mouth daily., Disp: , Rfl:  .  furosemide (LASIX) 80 MG tablet, Take 80 mg by mouth daily., Disp: , Rfl:  .  Glucosamine-Chondroitin (GLUCOSAMINE CHONDR COMPLEX PO), Take by mouth., Disp: , Rfl:  .  levothyroxine (SYNTHROID, LEVOTHROID) 100 MCG tablet, Take 100 mcg by mouth daily before breakfast., Disp: , Rfl:  .  metoprolol (LOPRESSOR) 50 MG tablet, Take 100 mg by mouth every evening. , Disp: , Rfl:  .  Multiple Vitamin (MULTIVITAMIN) tablet, Take 1 tablet by mouth daily., Disp: , Rfl:  .  omeprazole (PRILOSEC) 20 MG capsule, Take 20 mg by mouth daily., Disp: , Rfl:  .  pravastatin (PRAVACHOL) 40 MG tablet, Take 40 mg by mouth daily., Disp: , Rfl:  .  predniSONE (STERAPRED UNI-PAK) 10 MG tablet, , Disp: , Rfl: 0 .  tiotropium (SPIRIVA HANDIHALER) 18 MCG inhalation capsule, Place 1 capsule (18 mcg total) into inhaler and inhale daily., Disp: 30 capsule, Rfl: 6   Allergies  Allergen Reactions  . Penicillins  Caused pinpoint blisters to develop on the skin.     History  Substance Use Topics  . Smoking status: Never Smoker   . Smokeless tobacco: Not on file  . Alcohol Use: No    Family History  Problem Relation Age of Onset  . Cancer Father     MELANOMA AND THYROID CANCER  . Diabetes Father   . Hypertension Father   . Heart disease Father   . Diabetes Sister   . Hypertension Mother   . Hypertension Brother   . Heart disease Brother   . Cancer Brother     Lymphoma     Review of Systems  Constitutional: Negative.   HENT:  Negative.   Eyes: Negative.   Respiratory: Negative.   Cardiovascular: Negative.   Gastrointestinal: Negative.   Genitourinary: Negative.   Musculoskeletal: Positive for back pain and joint pain.  Skin: Negative.   Neurological: Negative.   Endo/Heme/Allergies: Negative.   Psychiatric/Behavioral: Negative.     Objective:  Physical Exam  Constitutional: She is oriented to person, place, and time. She appears well-developed and well-nourished.  HENT:  Head: Normocephalic and atraumatic.  Mouth/Throat: Oropharynx is clear and moist.  Eyes: Conjunctivae and EOM are normal. Pupils are equal, round, and reactive to light.  Neck: Neck supple.  Cardiovascular: Normal rate, regular rhythm and normal heart sounds.   Respiratory: Effort normal and breath sounds normal.  GI: Soft. Bowel sounds are normal.  Genitourinary:  Not pertinent to current symptomatology therefore not examined.  Musculoskeletal:  Examination of her left knee reveals pain medially and laterally, 1+ crepitation 1+ synovitis range of motion is from -5 to 120 degrees knee is stable with normal patella tracking. Exam of her left hip reveals posterior buttock and lateral thigh pain no groin pain, range of motion is intact  Neurological: She is alert and oriented to person, place, and time.  Skin: Skin is warm and dry.  Psychiatric: She has a normal mood and affect. Her behavior is normal.    Vital signs in last 24 hours: Temp:  [97.8 F (36.6 C)] 97.8 F (36.6 C) (01/20 1500) Pulse Rate:  [77] 77 (01/20 1500) BP: (137)/(80) 137/80 mmHg (01/20 1500) Weight:  [108.41 kg (239 lb)] 108.41 kg (239 lb) (01/20 1500)  Labs:   Estimated body mass index is 42.35 kg/(m^2) as calculated from the following:   Height as of this encounter:  (1.6 m).   Weight as of this encounter: 108.41 kg (239 lb).   Imaging Review  03-24-2014 Plain radiographs demonstrate severe degenerative joint disease of the left knee(s). The  overall alignment issignificant varus. The bone quality appears to be good for age and reported activity level.  Assessment/Plan:  End stage arthritis, left knee   The patient history, physical examination, clinical judgment of the provider and imaging studies are consistent with end stage degenerative joint disease of the left knee(s) and total knee arthroplasty is deemed medically necessary. The treatment options including medical management, injection therapy arthroscopy and arthroplasty were discussed at length. The risks and benefits of total knee arthroplasty were presented and reviewed. The risks due to aseptic loosening, infection, stiffness, patella tracking problems, thromboembolic complications and other imponderables were discussed. The patient acknowledged the explanation, agreed to proceed with the plan and consent was signed. Patient is being admitted for inpatient treatment for surgery, pain control, PT, OT, prophylactic antibiotics, VTE prophylaxis, progressive ambulation and ADL's and discharge planning. The patient is planning to be discharged home with home  health services   Sharrod Achille A. Gwinda Passe Physician Assistant [facility] [phone]  05/21/2014, 3:28 PM

## 2014-05-22 ENCOUNTER — Encounter (HOSPITAL_COMMUNITY): Payer: Self-pay

## 2014-05-22 ENCOUNTER — Encounter (HOSPITAL_COMMUNITY)
Admission: RE | Admit: 2014-05-22 | Discharge: 2014-05-22 | Disposition: A | Discharge status: Home / Self Care | Payer: Medicare Other | Source: Ambulatory Visit | Attending: Orthopedic Surgery | Admitting: Orthopedic Surgery

## 2014-05-22 DIAGNOSIS — M1712 Unilateral primary osteoarthritis, left knee: Secondary | ICD-10-CM | POA: Insufficient documentation

## 2014-05-22 DIAGNOSIS — Z01818 Encounter for other preprocedural examination: Secondary | ICD-10-CM | POA: Diagnosis present

## 2014-05-22 HISTORY — DX: Hypothyroidism, unspecified: E03.9

## 2014-05-22 HISTORY — DX: Personal history of other diseases of the digestive system: Z87.19

## 2014-05-22 HISTORY — DX: Gastro-esophageal reflux disease without esophagitis: K21.9

## 2014-05-22 LAB — CBC WITH DIFFERENTIAL/PLATELET
BASOS ABS: 0 10*3/uL (ref 0.0–0.1)
Basophils Relative: 0 % (ref 0–1)
EOS PCT: 2 % (ref 0–5)
Eosinophils Absolute: 0.2 10*3/uL (ref 0.0–0.7)
HCT: 44.7 % (ref 36.0–46.0)
Hemoglobin: 14.8 g/dL (ref 12.0–15.0)
Lymphocytes Relative: 13 % (ref 12–46)
Lymphs Abs: 1.5 10*3/uL (ref 0.7–4.0)
MCH: 29.4 pg (ref 26.0–34.0)
MCHC: 33.1 g/dL (ref 30.0–36.0)
MCV: 88.7 fL (ref 78.0–100.0)
Monocytes Absolute: 1.1 10*3/uL — ABNORMAL HIGH (ref 0.1–1.0)
Monocytes Relative: 10 % (ref 3–12)
Neutro Abs: 8.5 10*3/uL — ABNORMAL HIGH (ref 1.7–7.7)
Neutrophils Relative %: 75 % (ref 43–77)
Platelets: 307 10*3/uL (ref 150–400)
RBC: 5.04 MIL/uL (ref 3.87–5.11)
RDW: 13.8 % (ref 11.5–15.5)
WBC: 11.3 10*3/uL — ABNORMAL HIGH (ref 4.0–10.5)

## 2014-05-22 LAB — SURGICAL PCR SCREEN
MRSA, PCR: NEGATIVE
Staphylococcus aureus: POSITIVE — AB

## 2014-05-22 LAB — PROTIME-INR
INR: 0.94 (ref 0.00–1.49)
Prothrombin Time: 12.7 seconds (ref 11.6–15.2)

## 2014-05-22 LAB — COMPREHENSIVE METABOLIC PANEL
ALK PHOS: 61 U/L (ref 39–117)
ALT: 20 U/L (ref 0–35)
ANION GAP: 14 (ref 5–15)
AST: 24 U/L (ref 0–37)
Albumin: 3.7 g/dL (ref 3.5–5.2)
BILIRUBIN TOTAL: 0.6 mg/dL (ref 0.3–1.2)
BUN: 33 mg/dL — ABNORMAL HIGH (ref 6–23)
CHLORIDE: 98 meq/L (ref 96–112)
CO2: 26 mmol/L (ref 19–32)
CREATININE: 1.48 mg/dL — AB (ref 0.50–1.10)
Calcium: 9.4 mg/dL (ref 8.4–10.5)
GFR calc Af Amer: 40 mL/min — ABNORMAL LOW (ref >90)
GFR calc non Af Amer: 34 mL/min — ABNORMAL LOW (ref >90)
Glucose, Bld: 159 mg/dL — ABNORMAL HIGH (ref 70–99)
Potassium: 4 mmol/L (ref 3.5–5.1)
Sodium: 138 mmol/L (ref 135–145)
Total Protein: 6.6 g/dL (ref 6.0–8.3)

## 2014-05-22 LAB — URINALYSIS, ROUTINE W REFLEX MICROSCOPIC
BILIRUBIN URINE: NEGATIVE
Glucose, UA: NEGATIVE mg/dL
Hgb urine dipstick: NEGATIVE
Ketones, ur: NEGATIVE mg/dL
LEUKOCYTES UA: NEGATIVE
Nitrite: NEGATIVE
PROTEIN: NEGATIVE mg/dL
SPECIFIC GRAVITY, URINE: 1.01 (ref 1.005–1.030)
Urobilinogen, UA: 0.2 mg/dL (ref 0.0–1.0)
pH: 6 (ref 5.0–8.0)

## 2014-05-22 LAB — TYPE AND SCREEN
ABO/RH(D): A POS
ANTIBODY SCREEN: NEGATIVE

## 2014-05-22 LAB — APTT: aPTT: 26 seconds (ref 24–37)

## 2014-05-22 LAB — ABO/RH: ABO/RH(D): A POS

## 2014-05-22 NOTE — Pre-Procedure Instructions (Signed)
Valerie SheffieldLynda C Beck  05/22/2014   Your procedure is scheduled on:  Monday, February 1st  [facility].  Call this number if you have problems the morning of surgery: [phone]   Remember:   Do not eat food or drink liquids after midnight.   Take these medicines the morning of surgery with A SIP OF WATER: prilosec, synthroid, lopressor,spiriva  Stop taking aspirin, OTC vitamins/herbal medications, NSAIDS (ibuprofen, advil, motrin) 7 days prior to surgery.   Do not wear jewelry, make-up or nail polish.  Do not wear lotions, powders, or perfume, deodorant.  Do not shave 48 hours prior to surgery. Men may shave face and neck.  Do not bring valuables to the hospital.  [facility] any belongings or valuables.               Contacts, dentures or bridgework may not be worn into surgery.  Leave suitcase in the car. After surgery it may be brought to your room.  For patients admitted to the hospital, discharge time is determined by your  treatment team.         Please read over the following fact sheets that you were given: Pain Booklet, Coughing and Deep Breathing, Blood Transfusion Information, MRSA Information and Surgical Site Infection Prevention  [place] - Preparing for Surgery  Before surgery, you can play an important role.  Because skin is not sterile, your skin needs to be as free of germs as possible.  You can reduce the number of germs on you skin by washing with CHG (chlorahexidine gluconate) soap before surgery.  CHG is an antiseptic cleaner which kills germs and bonds with the skin to continue killing germs even after washing.  Please DO NOT use if you have an allergy to CHG or antibacterial soaps.  If your skin becomes reddened/irritated stop using the CHG and inform your nurse when you arrive at Short Stay.  Do not shave (including legs and underarms) for at least 48 hours prior to the first CHG shower.  You may shave  your face.  Please follow these instructions carefully:   1.  Shower with CHG Soap the night before surgery and the morning of Surgery.  2.  If you choose to wash your hair, wash your hair first as usual with your normal shampoo.  3.  After you shampoo, rinse your hair and body thoroughly to remove the shampoo.  4.  Use CHG as you would any other liquid soap.  You can apply CHG directly to the skin and wash gently with scrungie or a clean washcloth.  5.  Apply the CHG Soap to your body ONLY FROM THE NECK DOWN.  Do not use on open wounds or open sores.  Avoid contact with your eyes, ears, mouth and genitals (private parts).  Wash genitals (private parts) with your normal soap.  6.  Wash thoroughly, paying special attention to the area where your surgery will be performed.  7.  Thoroughly rinse your body with warm water from the neck down.  8.  DO NOT shower/wash with your normal soap after using and rinsing off the CHG Soap.  9.  Pat yourself dry with a clean towel.            10.  Wear clean pajamas.            11.  Place clean sheets on your bed the night of your first shower  and do not sleep with pets.  Day of Surgery  Do not apply any lotions/deoderants the morning of surgery.  Please wear clean clothes to the hospital/surgery center.

## 2014-05-22 NOTE — Progress Notes (Signed)
Primary - dr. Windle GuardWilson elkins Cardiologist - dr. SwazilandJordan  clearance in epic, ekg in epic 2015, stress and echo in epic 2014

## 2014-05-23 LAB — URINE CULTURE
COLONY COUNT: NO GROWTH
Culture: NO GROWTH

## 2014-06-01 MED ORDER — VANCOMYCIN HCL 10 G IV SOLR
1500.0000 mg | INTRAVENOUS | Status: AC
  Administered 2014-06-02: 1500 mg via INTRAVENOUS
  Filled 2014-06-01: qty 1500

## 2014-06-02 ENCOUNTER — Encounter (HOSPITAL_COMMUNITY)
Admission: RE | Disposition: A | Discharge status: Home / Self Care | Payer: Self-pay | Source: Ambulatory Visit | Attending: Orthopedic Surgery

## 2014-06-02 ENCOUNTER — Inpatient Hospital Stay (HOSPITAL_COMMUNITY)
Admission: RE | Admit: 2014-06-02 | Discharge: 2014-06-04 | DRG: 470 | Disposition: A | Discharge status: Home / Self Care | Payer: Medicare Other | Source: Ambulatory Visit | Attending: Orthopedic Surgery | Admitting: Orthopedic Surgery

## 2014-06-02 ENCOUNTER — Inpatient Hospital Stay (HOSPITAL_COMMUNITY): Payer: Medicare Other | Admitting: Anesthesiology

## 2014-06-02 ENCOUNTER — Encounter (HOSPITAL_COMMUNITY): Payer: Self-pay | Admitting: Certified Registered"

## 2014-06-02 DIAGNOSIS — M179 Osteoarthritis of knee, unspecified: Secondary | ICD-10-CM | POA: Diagnosis present

## 2014-06-02 DIAGNOSIS — Z7951 Long term (current) use of inhaled steroids: Secondary | ICD-10-CM

## 2014-06-02 DIAGNOSIS — J441 Chronic obstructive pulmonary disease with (acute) exacerbation: Secondary | ICD-10-CM | POA: Diagnosis not present

## 2014-06-02 DIAGNOSIS — M1712 Unilateral primary osteoarthritis, left knee: Secondary | ICD-10-CM | POA: Diagnosis present

## 2014-06-02 DIAGNOSIS — I10 Essential (primary) hypertension: Secondary | ICD-10-CM | POA: Diagnosis present

## 2014-06-02 DIAGNOSIS — J449 Chronic obstructive pulmonary disease, unspecified: Secondary | ICD-10-CM | POA: Diagnosis present

## 2014-06-02 DIAGNOSIS — E039 Hypothyroidism, unspecified: Secondary | ICD-10-CM | POA: Diagnosis present

## 2014-06-02 DIAGNOSIS — Z880 Allergy status to penicillin: Secondary | ICD-10-CM | POA: Diagnosis not present

## 2014-06-02 DIAGNOSIS — Z6841 Body Mass Index (BMI) 40.0 and over, adult: Secondary | ICD-10-CM

## 2014-06-02 DIAGNOSIS — R7981 Abnormal blood-gas level: Secondary | ICD-10-CM

## 2014-06-02 DIAGNOSIS — K219 Gastro-esophageal reflux disease without esophagitis: Secondary | ICD-10-CM | POA: Diagnosis present

## 2014-06-02 DIAGNOSIS — M1710 Unilateral primary osteoarthritis, unspecified knee: Secondary | ICD-10-CM | POA: Diagnosis present

## 2014-06-02 DIAGNOSIS — R0600 Dyspnea, unspecified: Secondary | ICD-10-CM | POA: Diagnosis present

## 2014-06-02 DIAGNOSIS — E079 Disorder of thyroid, unspecified: Secondary | ICD-10-CM | POA: Diagnosis present

## 2014-06-02 DIAGNOSIS — IMO0001 Reserved for inherently not codable concepts without codable children: Secondary | ICD-10-CM | POA: Diagnosis present

## 2014-06-02 DIAGNOSIS — M25562 Pain in left knee: Secondary | ICD-10-CM | POA: Diagnosis present

## 2014-06-02 DIAGNOSIS — Z96652 Presence of left artificial knee joint: Secondary | ICD-10-CM

## 2014-06-02 HISTORY — DX: Unilateral primary osteoarthritis, left knee: M17.12

## 2014-06-02 HISTORY — DX: Presence of left artificial knee joint: Z96.652

## 2014-06-02 HISTORY — PX: TOTAL KNEE ARTHROPLASTY: SHX125

## 2014-06-02 SURGERY — ARTHROPLASTY, KNEE, TOTAL
Anesthesia: Monitor Anesthesia Care | Laterality: Left

## 2014-06-02 MED ORDER — FENTANYL CITRATE 0.05 MG/ML IJ SOLN
INTRAMUSCULAR | Status: AC
  Filled 2014-06-02: qty 5

## 2014-06-02 MED ORDER — POTASSIUM CHLORIDE IN NACL 20-0.9 MEQ/L-% IV SOLN
INTRAVENOUS | Status: DC
  Administered 2014-06-02 – 2014-06-03 (×2): via INTRAVENOUS
  Filled 2014-06-02 (×7): qty 1000

## 2014-06-02 MED ORDER — ONDANSETRON HCL 4 MG PO TABS: 4.0000 mg | ORAL_TABLET | Freq: Four times a day (QID) | ORAL | Status: DC | PRN

## 2014-06-02 MED ORDER — DIPHENHYDRAMINE HCL 12.5 MG/5ML PO ELIX: 12.5000 mg | ORAL_SOLUTION | ORAL | Status: DC | PRN

## 2014-06-02 MED ORDER — CHLORHEXIDINE GLUCONATE 4 % EX LIQD
60.0000 mL | Freq: Once | CUTANEOUS | Status: DC
  Filled 2014-06-02: qty 60

## 2014-06-02 MED ORDER — ROCURONIUM BROMIDE 50 MG/5ML IV SOLN
INTRAVENOUS | Status: AC
  Filled 2014-06-02: qty 1

## 2014-06-02 MED ORDER — PROPOFOL INFUSION 10 MG/ML OPTIME
INTRAVENOUS | Status: DC | PRN
  Administered 2014-06-02: 100 ug/kg/min via INTRAVENOUS

## 2014-06-02 MED ORDER — METOCLOPRAMIDE HCL 5 MG/ML IJ SOLN: 5.0000 mg | Freq: Three times a day (TID) | INTRAMUSCULAR | Status: DC | PRN

## 2014-06-02 MED ORDER — SODIUM CHLORIDE 0.9 % IR SOLN
Status: DC | PRN
  Administered 2014-06-02: 1000 mL

## 2014-06-02 MED ORDER — CELECOXIB 200 MG PO CAPS
200.0000 mg | ORAL_CAPSULE | Freq: Two times a day (BID) | ORAL | Status: DC
  Administered 2014-06-02 – 2014-06-04 (×4): 200 mg via ORAL
  Filled 2014-06-02 (×6): qty 1

## 2014-06-02 MED ORDER — PHENOL 1.4 % MT LIQD: 1.0000 | OROMUCOSAL | Status: DC | PRN

## 2014-06-02 MED ORDER — MENTHOL 3 MG MT LOZG: 1.0000 | LOZENGE | OROMUCOSAL | Status: DC | PRN

## 2014-06-02 MED ORDER — GLYCOPYRROLATE 0.2 MG/ML IJ SOLN
INTRAMUSCULAR | Status: AC
  Filled 2014-06-02: qty 3

## 2014-06-02 MED ORDER — VANCOMYCIN HCL IN DEXTROSE 1-5 GM/200ML-% IV SOLN
1000.0000 mg | Freq: Two times a day (BID) | INTRAVENOUS | Status: AC
  Administered 2014-06-02: 1000 mg via INTRAVENOUS
  Filled 2014-06-02: qty 200

## 2014-06-02 MED ORDER — MIDAZOLAM HCL 2 MG/2ML IJ SOLN
INTRAMUSCULAR | Status: AC
  Filled 2014-06-02: qty 2

## 2014-06-02 MED ORDER — PROPOFOL 10 MG/ML IV BOLUS
INTRAVENOUS | Status: AC
  Filled 2014-06-02: qty 20

## 2014-06-02 MED ORDER — NEOSTIGMINE METHYLSULFATE 10 MG/10ML IV SOLN
INTRAVENOUS | Status: AC
  Filled 2014-06-02: qty 1

## 2014-06-02 MED ORDER — HYDROMORPHONE HCL 1 MG/ML IJ SOLN: 0.2500 mg | INTRAMUSCULAR | Status: DC | PRN

## 2014-06-02 MED ORDER — ACETAMINOPHEN 650 MG RE SUPP: 650.0000 mg | Freq: Four times a day (QID) | RECTAL | Status: DC | PRN

## 2014-06-02 MED ORDER — LIDOCAINE HCL (CARDIAC) 20 MG/ML IV SOLN
INTRAVENOUS | Status: AC
Start: 2014-06-02 — End: 2014-06-02
  Filled 2014-06-02: qty 5

## 2014-06-02 MED ORDER — OXYCODONE HCL 5 MG PO TABS
5.0000 mg | ORAL_TABLET | ORAL | Status: DC | PRN
  Administered 2014-06-02 – 2014-06-04 (×9): 10 mg via ORAL
  Filled 2014-06-02 (×9): qty 2

## 2014-06-02 MED ORDER — DEXAMETHASONE SODIUM PHOSPHATE 10 MG/ML IJ SOLN
10.0000 mg | Freq: Three times a day (TID) | INTRAMUSCULAR | Status: DC
  Administered 2014-06-02 – 2014-06-04 (×5): 10 mg via INTRAVENOUS
  Filled 2014-06-02 (×8): qty 1

## 2014-06-02 MED ORDER — BUPIVACAINE-EPINEPHRINE (PF) 0.25% -1:200000 IJ SOLN
INTRAMUSCULAR | Status: AC
  Filled 2014-06-02: qty 30

## 2014-06-02 MED ORDER — FENTANYL CITRATE 0.05 MG/ML IJ SOLN
50.0000 ug | INTRAMUSCULAR | Status: DC | PRN
  Administered 2014-06-02: 100 ug via INTRAVENOUS
  Filled 2014-06-02: qty 2

## 2014-06-02 MED ORDER — ONDANSETRON HCL 4 MG/2ML IJ SOLN
INTRAMUSCULAR | Status: AC
  Filled 2014-06-02: qty 2

## 2014-06-02 MED ORDER — LACTATED RINGERS IV SOLN
INTRAVENOUS | Status: DC
  Administered 2014-06-02 (×3): via INTRAVENOUS

## 2014-06-02 MED ORDER — HYDROMORPHONE HCL 1 MG/ML IJ SOLN
1.0000 mg | INTRAMUSCULAR | Status: DC | PRN
  Administered 2014-06-02 – 2014-06-03 (×9): 1 mg via INTRAVENOUS
  Filled 2014-06-02 (×11): qty 1

## 2014-06-02 MED ORDER — BUPIVACAINE-EPINEPHRINE (PF) 0.5% -1:200000 IJ SOLN
INTRAMUSCULAR | Status: DC | PRN
  Administered 2014-06-02: 30 mL via PERINEURAL

## 2014-06-02 MED ORDER — 0.9 % SODIUM CHLORIDE (POUR BTL) OPTIME
TOPICAL | Status: DC | PRN
  Administered 2014-06-02 (×3): 1000 mL

## 2014-06-02 MED ORDER — BUPIVACAINE IN DEXTROSE 0.75-8.25 % IT SOLN
INTRATHECAL | Status: DC | PRN
  Administered 2014-06-02: 15 mg via INTRATHECAL

## 2014-06-02 MED ORDER — METOCLOPRAMIDE HCL 10 MG PO TABS: 5.0000 mg | ORAL_TABLET | Freq: Three times a day (TID) | ORAL | Status: DC | PRN

## 2014-06-02 MED ORDER — DOCUSATE SODIUM 100 MG PO CAPS
100.0000 mg | ORAL_CAPSULE | Freq: Two times a day (BID) | ORAL | Status: DC
  Administered 2014-06-02 – 2014-06-04 (×4): 100 mg via ORAL
  Filled 2014-06-02 (×5): qty 1

## 2014-06-02 MED ORDER — BUPIVACAINE-EPINEPHRINE 0.25% -1:200000 IJ SOLN
INTRAMUSCULAR | Status: DC | PRN
  Administered 2014-06-02: 30 mg

## 2014-06-02 MED ORDER — POVIDONE-IODINE 7.5 % EX SOLN
Freq: Once | CUTANEOUS | Status: DC
  Filled 2014-06-02: qty 118

## 2014-06-02 MED ORDER — POLYETHYLENE GLYCOL 3350 17 G PO PACK
17.0000 g | PACK | Freq: Two times a day (BID) | ORAL | Status: DC
  Administered 2014-06-02 – 2014-06-04 (×4): 17 g via ORAL
  Filled 2014-06-02 (×5): qty 1

## 2014-06-02 MED ORDER — DEXAMETHASONE SODIUM PHOSPHATE 10 MG/ML IJ SOLN
INTRAMUSCULAR | Status: DC | PRN
  Administered 2014-06-02: 10 mg via INTRAVENOUS

## 2014-06-02 MED ORDER — ALUM & MAG HYDROXIDE-SIMETH 200-200-20 MG/5ML PO SUSP: 30.0000 mL | ORAL | Status: DC | PRN

## 2014-06-02 MED ORDER — ONDANSETRON HCL 4 MG/2ML IJ SOLN
INTRAMUSCULAR | Status: DC | PRN
  Administered 2014-06-02: 4 mg via INTRAVENOUS

## 2014-06-02 MED ORDER — APIXABAN 2.5 MG PO TABS
2.5000 mg | ORAL_TABLET | Freq: Two times a day (BID) | ORAL | Status: DC
Start: 2014-06-03 — End: 2014-06-04
  Administered 2014-06-03 – 2014-06-04 (×3): 2.5 mg via ORAL
  Filled 2014-06-02 (×4): qty 1

## 2014-06-02 MED ORDER — ACETAMINOPHEN 325 MG PO TABS
650.0000 mg | ORAL_TABLET | Freq: Four times a day (QID) | ORAL | Status: DC | PRN
  Administered 2014-06-04: 650 mg via ORAL
  Filled 2014-06-02: qty 2

## 2014-06-02 MED ORDER — ONDANSETRON HCL 4 MG/2ML IJ SOLN
4.0000 mg | Freq: Four times a day (QID) | INTRAMUSCULAR | Status: DC | PRN
  Administered 2014-06-03 – 2014-06-04 (×3): 4 mg via INTRAVENOUS
  Filled 2014-06-02 (×3): qty 2

## 2014-06-02 MED ORDER — MIDAZOLAM HCL 2 MG/2ML IJ SOLN
1.0000 mg | INTRAMUSCULAR | Status: DC | PRN
  Administered 2014-06-02 (×3): 1 mg via INTRAVENOUS
  Filled 2014-06-02: qty 2

## 2014-06-02 SURGICAL SUPPLY — 80 items
APL SKNCLS STERI-STRIP NONHPOA (GAUZE/BANDAGES/DRESSINGS) ×1
BANDAGE ESMARK 6X9 LF (GAUZE/BANDAGES/DRESSINGS) ×1 IMPLANT
BENZOIN TINCTURE PRP APPL 2/3 (GAUZE/BANDAGES/DRESSINGS) ×3 IMPLANT
BLADE SAGITTAL 25.0X1.19X90 (BLADE) ×2 IMPLANT
BLADE SAGITTAL 25.0X1.19X90MM (BLADE) ×1
BLADE SAW SGTL 11.0X1.19X90.0M (BLADE) IMPLANT
BLADE SAW SGTL 13.0X1.19X90.0M (BLADE) ×3 IMPLANT
BLADE SURG 10 STRL SS (BLADE) ×6 IMPLANT
BNDG CMPR 9X6 STRL LF SNTH (GAUZE/BANDAGES/DRESSINGS) ×1
BNDG CMPR MED 15X6 ELC VLCR LF (GAUZE/BANDAGES/DRESSINGS) ×1
BNDG ELASTIC 6X15 VLCR STRL LF (GAUZE/BANDAGES/DRESSINGS) ×3 IMPLANT
BNDG ESMARK 6X9 LF (GAUZE/BANDAGES/DRESSINGS) ×3
BOWL SMART MIX CTS (DISPOSABLE) ×3 IMPLANT
CAP KNEE TOTAL 3 SIGMA ×2 IMPLANT
CEMENT HV SMART SET (Cement) ×6 IMPLANT
CLOSURE STERI-STRIP 1/2X4 (GAUZE/BANDAGES/DRESSINGS) ×1
CLOSURE WOUND 1/2 X4 (GAUZE/BANDAGES/DRESSINGS) ×1
CLSR STERI-STRIP ANTIMIC 1/2X4 (GAUZE/BANDAGES/DRESSINGS) ×1 IMPLANT
COVER SURGICAL LIGHT HANDLE (MISCELLANEOUS) ×3 IMPLANT
CUFF TOURNIQUET SINGLE 34IN LL (TOURNIQUET CUFF) ×3 IMPLANT
DRAPE EXTREMITY T 121X128X90 (DRAPE) ×3 IMPLANT
DRAPE IMP U-DRAPE 54X76 (DRAPES) ×3 IMPLANT
DRAPE INCISE IOBAN 66X45 STRL (DRAPES) ×3 IMPLANT
DRAPE PROXIMA HALF (DRAPES) ×3 IMPLANT
DRAPE U-SHAPE 47X51 STRL (DRAPES) ×3 IMPLANT
DRSG AQUACEL AG ADV 3.5X14 (GAUZE/BANDAGES/DRESSINGS) ×3 IMPLANT
DRSG PAD ABDOMINAL 8X10 ST (GAUZE/BANDAGES/DRESSINGS) ×6 IMPLANT
DURAPREP 26ML APPLICATOR (WOUND CARE) ×8 IMPLANT
ELECT CAUTERY BLADE 6.4 (BLADE) ×3 IMPLANT
ELECT REM PT RETURN 9FT ADLT (ELECTROSURGICAL) ×3
ELECTRODE REM PT RTRN 9FT ADLT (ELECTROSURGICAL) ×1 IMPLANT
EVACUATOR 1/8 PVC DRAIN (DRAIN) ×3 IMPLANT
FACESHIELD WRAPAROUND (MASK) ×3 IMPLANT
FACESHIELD WRAPAROUND OR TEAM (MASK) ×1 IMPLANT
GAUZE SPONGE 4X4 12PLY STRL (GAUZE/BANDAGES/DRESSINGS) ×3 IMPLANT
GLOVE BIO SURGEON STRL SZ7 (GLOVE) ×3 IMPLANT
GLOVE BIOGEL PI IND STRL 6 (GLOVE) IMPLANT
GLOVE BIOGEL PI IND STRL 6.5 (GLOVE) IMPLANT
GLOVE BIOGEL PI IND STRL 7.0 (GLOVE) ×1 IMPLANT
GLOVE BIOGEL PI IND STRL 7.5 (GLOVE) ×1 IMPLANT
GLOVE BIOGEL PI INDICATOR 6 (GLOVE) ×2
GLOVE BIOGEL PI INDICATOR 6.5 (GLOVE) ×2
GLOVE BIOGEL PI INDICATOR 7.0 (GLOVE) ×2
GLOVE BIOGEL PI INDICATOR 7.5 (GLOVE) ×2
GLOVE SS BIOGEL STRL SZ 7.5 (GLOVE) ×1 IMPLANT
GLOVE SUPERSENSE BIOGEL SZ 7.5 (GLOVE) ×2
GLOVE SURG ORTHO 7.0 STRL STRW (GLOVE) ×4 IMPLANT
GOWN STRL REUS W/ TWL LRG LVL3 (GOWN DISPOSABLE) ×2 IMPLANT
GOWN STRL REUS W/ TWL XL LVL3 (GOWN DISPOSABLE) ×2 IMPLANT
GOWN STRL REUS W/TWL LRG LVL3 (GOWN DISPOSABLE) ×6
GOWN STRL REUS W/TWL XL LVL3 (GOWN DISPOSABLE) ×6
HANDPIECE INTERPULSE COAX TIP (DISPOSABLE) ×3
HOOD PEEL AWAY FACE SHEILD DIS (HOOD) ×6 IMPLANT
IMMOBILIZER KNEE 22 (SOFTGOODS) ×2 IMPLANT
IMMOBILIZER KNEE 22 UNIV (SOFTGOODS) IMPLANT
KIT BASIN OR (CUSTOM PROCEDURE TRAY) ×3 IMPLANT
KIT ROOM TURNOVER OR (KITS) ×3 IMPLANT
MANIFOLD NEPTUNE II (INSTRUMENTS) ×3 IMPLANT
MARKER SKIN DUAL TIP RULER LAB (MISCELLANEOUS) ×3 IMPLANT
NS IRRIG 1000ML POUR BTL (IV SOLUTION) ×3 IMPLANT
PACK TOTAL JOINT (CUSTOM PROCEDURE TRAY) ×3 IMPLANT
PACK UNIVERSAL I (CUSTOM PROCEDURE TRAY) ×3 IMPLANT
PAD ARMBOARD 7.5X6 YLW CONV (MISCELLANEOUS) ×6 IMPLANT
PADDING CAST COTTON 6X4 STRL (CAST SUPPLIES) ×3 IMPLANT
RUBBERBAND STERILE (MISCELLANEOUS) ×3 IMPLANT
SET HNDPC FAN SPRY TIP SCT (DISPOSABLE) ×1 IMPLANT
SPONGE GAUZE 4X4 12PLY STER LF (GAUZE/BANDAGES/DRESSINGS) ×2 IMPLANT
STRIP CLOSURE SKIN 1/2X4 (GAUZE/BANDAGES/DRESSINGS) ×2 IMPLANT
SUCTION FRAZIER TIP 10 FR DISP (SUCTIONS) ×3 IMPLANT
SUT ETHIBOND NAB CT1 #1 30IN (SUTURE) ×6 IMPLANT
SUT MNCRL AB 3-0 PS2 18 (SUTURE) ×3 IMPLANT
SUT VIC AB 0 CT1 27 (SUTURE) ×6
SUT VIC AB 0 CT1 27XBRD ANBCTR (SUTURE) ×2 IMPLANT
SUT VIC AB 2-0 CT1 27 (SUTURE) ×6
SUT VIC AB 2-0 CT1 TAPERPNT 27 (SUTURE) ×2 IMPLANT
SYR 30ML SLIP (SYRINGE) ×3 IMPLANT
TOWEL OR 17X24 6PK STRL BLUE (TOWEL DISPOSABLE) ×3 IMPLANT
TOWEL OR 17X26 10 PK STRL BLUE (TOWEL DISPOSABLE) ×3 IMPLANT
TRAY FOLEY CATH 16FR SILVER (SET/KITS/TRAYS/PACK) ×3 IMPLANT
WATER STERILE IRR 1000ML POUR (IV SOLUTION) ×2 IMPLANT

## 2014-06-02 NOTE — Interval H&P Note (Signed)
History and Physical Interval Note:  06/02/2014 8:45 AM  Valerie SheffieldLynda C Martinez  has presented today for surgery, with the diagnosis of primary localized OA left knee  The various methods of treatment have been discussed with the patient and family. After consideration of risks, benefits and other options for treatment, the patient has consented to  Procedure(s): TOTAL KNEE ARTHROPLASTY (Left) as a surgical intervention .  The patient's history has been reviewed, patient examined, no change in status, stable for surgery.  I have reviewed the patient's chart and labs.  Questions were answered to the patient's satisfaction.     [name],Thelbert Gartin A

## 2014-06-02 NOTE — Transfer of Care (Signed)
Immediate Anesthesia Transfer of Care Note  Patient: Valerie Beck  Procedure(s) Performed: Procedure(s): TOTAL KNEE ARTHROPLASTY (Left)  Patient Location: PACU  Anesthesia Type:Spinal  Level of Consciousness: awake and alert   Airway & Oxygen Therapy: Patient Spontanous Breathing and Patient connected to nasal cannula oxygen  Post-op Assessment: Report given to RN and Post -op Vital signs reviewed and stable  Post vital signs: Reviewed and stable  Last Vitals:  Filed Vitals:   06/02/14 0840  BP: 151/63  Pulse: 64  Temp:   Resp: 12    Complications: No apparent anesthesia complications

## 2014-06-02 NOTE — Progress Notes (Signed)
Orthopedic Tech Progress Note Patient Details:  Valerie Beck 1940/06/03 [id number]  CPM Left Knee CPM Left Knee: On Left Knee Flexion (Degrees): 90 Left Knee Extension (Degrees): 0 Additional Comments: trapeze bar patient helper Viewed order from doctor's order list  Nikki DomCrawford, Rohail Klees 06/02/2014, 11:29 AM

## 2014-06-02 NOTE — Anesthesia Procedure Notes (Addendum)
Anesthesia Regional Block:  Adductor canal block  Pre-Anesthetic Checklist: ,, timeout performed, Correct Patient, Correct Site, Correct Laterality, Correct Procedure, Correct Position, site marked, Risks and benefits discussed, pre-op evaluation,  At surgeon's request and post-op pain management  Laterality: Left  Prep: Maximum Sterile Barrier Precautions used and chloraprep       Needles:  Injection technique: Single-shot  Needle Type: Echogenic Stimulator Needle     Needle Length: 5cm 5 cm Needle Gauge: 22 and 22 G    Additional Needles:  Procedures: ultrasound guided (picture in chart) Adductor canal block  Nerve Stimulator or Paresthesia:  Response: Patellar respose,   Additional Responses:   Narrative:  Start time: 06/02/2014 8:20 AM End time: 06/02/2014 8:30 AM Injection made incrementally with aspirations every 5 mL. Anesthesiologist: Gaynelle AduFITZGERALD, Fouad Taul E  Additional Notes: 2% Lidocaine skin wheel.    Spinal Patient location during procedure: OR Start time: 06/02/2014 8:54 AM End time: 06/02/2014 9:01 AM Staffing Anesthesiologist: Gaynelle AduFITZGERALD, Jordy Hewins E Performed by: anesthesiologist  Preanesthetic Checklist Completed: patient identified, surgical consent, pre-op evaluation, timeout performed, IV checked, risks and benefits discussed and monitors and equipment checked Spinal Block Patient position: sitting Prep: Betadine Patient monitoring: cardiac monitor, continuous pulse ox and blood pressure Approach: midline Location: L3-4 Injection technique: single-shot Needle Needle type: Pencan  Needle gauge: 24 G Needle length: 9 cm Assessment Sensory level: T10 Additional Notes Functioning IV was confirmed and monitors were applied. Sterile prep and drape, including hand hygiene and sterile gloves were used. The patient was positioned and the spine was prepped. The skin was anesthetized with lidocaine.  Free flow of clear CSF was obtained prior to injecting  local anesthetic into the CSF.  The spinal needle aspirated freely following injection.  The needle was carefully withdrawn.  The patient tolerated the procedure well.

## 2014-06-02 NOTE — Progress Notes (Signed)
Utilization review completed.  

## 2014-06-02 NOTE — Anesthesia Postprocedure Evaluation (Signed)
  Anesthesia Post-op Note  Patient: Valerie Beck  Procedure(s) Performed: Procedure(s): TOTAL KNEE ARTHROPLASTY (Left)  Patient Location: PACU  Anesthesia Type:Spinal  Level of Consciousness: awake, alert , oriented, patient cooperative and responds to stimulation  Airway and Oxygen Therapy: Patient Spontanous Breathing  Post-op Pain: none  Post-op Assessment: Post-op Vital signs reviewed, Patient's Cardiovascular Status Stable, Respiratory Function Stable, Patent Airway, No signs of Nausea or vomiting, Pain level controlled and No headache  Post-op Vital Signs: Reviewed and stable  Last Vitals:  Filed Vitals:   06/02/14 1134  BP:   Pulse: 69  Temp: 36.5 C  Resp: 18    Complications: No apparent anesthesia complications

## 2014-06-02 NOTE — Progress Notes (Signed)
Physical Therapy Evaluation Patient Details Name: Valerie Beck MRN: [id number] DOB: 1940/07/18 Today's Date: 06/02/2014   History of Present Illness  Patient is a 74 yo female admitted 06/02/14 now s/p Lt TKA.  PMH:  OA, COPD, HTN, Thyroid disease  Clinical Impression  Patient presents with problems listed below. Will benefit from acute PT to maximize independence prior to discharge home with family.    Follow Up Recommendations Home health PT;Supervision/Assistance - 24 hour    Equipment Recommendations  None recommended by PT    Recommendations for Other Services       Precautions / Restrictions Precautions Precautions: Knee Precaution Booklet Issued: Yes (comment) Precaution Comments: Reviewed precautions with patient and her sister Required Braces or Orthoses: Knee Immobilizer - Left Knee Immobilizer - Left: On when out of bed or walking;Discontinue once straight leg raise with < 10 degree lag Restrictions Weight Bearing Restrictions: Yes LLE Weight Bearing: Weight bearing as tolerated      Mobility  Bed Mobility Overal bed mobility: Needs Assistance Bed Mobility: Supine to Sit     Supine to sit: Min assist     General bed mobility comments: Instructed patient and her sister on donning KI on LLE.  Verbal cues for bed mobility technique.  Assist to move LLE off of bed, and to raise trunk to sitting.  Good balance once upright.  Transfers Overall transfer level: Needs assistance Equipment used: Rolling walker (2 wheeled) Transfers: Sit to/from [employer] Sit to Stand: Min assist Stand pivot transfers: Min assist       General transfer comment: Verbal cues for hand placement and technique.  Assist to rise to standing and for balance initially.  Patient able to take several steps to pivot to chair.  Assist to control descent into chair.  Ambulation/Gait                Stairs            Wheelchair Mobility    Modified Rankin  (Stroke Patients Only)       Balance                                             Pertinent Vitals/Pain Pain Assessment: 0-10 Pain Score: 5  Pain Location: Lt knee Pain Descriptors / Indicators: Aching;Sore Pain Intervention(s): Limited activity within patient's tolerance;Premedicated before session;Repositioned    Home Living Family/patient expects to be discharged to:: Private residence Living Arrangements: Spouse/significant other Available Help at Discharge: Family;Available 24 hours/day (Husband, daughter, and sister) Type of Home: House Home Access: Stairs to enter Entrance Stairs-Rails: Right Entrance Stairs-Number of Steps: 3 Home Layout: One level Home Equipment: Walker - 2 wheels;Cane - single point;Bedside commode      Prior Function Level of Independence: Independent with assistive device(s)         Comments: Using cane pta     Hand Dominance        Extremity/Trunk Assessment   Upper Extremity Assessment: Overall WFL for tasks assessed           Lower Extremity Assessment: LLE deficits/detail   LLE Deficits / Details: Decreased strength and ROM post-op     Communication   Communication: No difficulties  Cognition Arousal/Alertness: Awake/alert Behavior During Therapy: WFL for tasks assessed/performed Overall Cognitive Status: Within Functional Limits for tasks assessed  General Comments      Exercises Total Joint Exercises Ankle Circles/Pumps: AROM;Both;10 reps;Seated      Assessment/Plan    PT Assessment Patient needs continued PT services  PT Diagnosis Difficulty walking;Acute pain   PT Problem List Decreased strength;Decreased range of motion;Decreased activity tolerance;Decreased balance;Decreased mobility;Decreased knowledge of use of DME;Decreased knowledge of precautions;Pain  PT Treatment Interventions DME instruction;Gait training;Stair training;Functional mobility  training;Therapeutic activities;Therapeutic exercise;Patient/family education   PT Goals (Current goals can be found in the Care Plan section) Acute Rehab PT Goals Patient Stated Goal: To walk PT Goal Formulation: With patient/family Time For Goal Achievement: 06/09/14 Potential to Achieve Goals: Good    Frequency 7X/week   Barriers to discharge        Co-evaluation               End of Session Equipment Utilized During Treatment: Gait belt;Left knee immobilizer Activity Tolerance: Patient tolerated treatment well Patient left: in chair;with call bell/phone within reach;with family/visitor present Nurse Communication: Mobility status         Time: [id number] PT Time Calculation (min) (ACUTE ONLY): 42 min   Charges:   PT Evaluation $Initial PT Evaluation Tier I: 1 Procedure PT Treatments $Therapeutic Activity: 23-37 mins   PT G CodesVena Austria:        Karina Lenderman H 06/02/2014, 7:01 PM Durenda HurtSusan H. Renaldo Fiddleravis, PT, [facility] Acute Rehab Services Pager [phone]

## 2014-06-02 NOTE — Op Note (Signed)
MRN:     [id number] DOB/AGE:    [birth year] / 74 y.o.       OPERATIVE REPORT    DATE OF PROCEDURE:  06/02/2014       PREOPERATIVE DIAGNOSIS:   Primary localized OA left knee      Estimated body mass index is 42.81 kg/(m^2) as calculated from the following:   Height as of this encounter: 5\' 3"  (1.6 m).   Weight as of this encounter: 109.6 kg (241 lb 10 oz).                                                        POSTOPERATIVE DIAGNOSIS:   same                                                                   PROCEDURE:  Procedure(s): TOTAL KNEE ARTHROPLASTY Using Depuy Sigma RP implants #3 Femur, #3Tibia, 12.645mm sigma RP bearing, 32 Patella     SURGEON: Ajai Harville A    ASSISTANT:  Kirstin Shepperson PA-C   (Present and scrubbed throughout the case, critical for assistance with exposure, retraction, instrumentation, and closure.)         ANESTHESIA: Spinal with Femoral Nerve Block  DRAINS: foley, 2 medium hemovac in knee   TOURNIQUET TIME: 75min   COMPLICATIONS:  None     SPECIMENS: None   INDICATIONS FOR PROCEDURE: The patient has  djd left knee, varus deformities, XR shows bone on bone arthritis. Patient has failed all conservative measures including anti-inflammatory medicines, narcotics, attempts at  exercise and weight loss, cortisone injections and viscosupplementation.  Risks and benefits of surgery have been discussed, questions answered.   DESCRIPTION OF PROCEDURE: The patient identified by armband, received  right femoral nerve block and IV antibiotics[facility] Patient taken to the operating room, appropriate anesthetic  monitors were attached General endotracheal anesthesia induced with  the patient in supine position, Foley catheter was inserted. Tourniquet  applied high to the operative thigh. Lateral post and foot positioner  applied to the table, the lower extremity was then prepped and draped  in usual sterile fashion from the  ankle to the tourniquet. Time-out procedure was performed. The limb was wrapped with an Esmarch bandage and the tourniquet inflated to 365 mmHg. We began the operation by making the anterior midline incision starting at handbreadth above the patella going over the patella 1 cm medial to and  4 cm distal to the tibial tubercle. Small bleeders in the skin and the  subcutaneous tissue identified and cauterized. Transverse retinaculum was incised and reflected medially and a medial parapatellar arthrotomy was accomplished. the patella was everted and theprepatellar fat pad resected. The superficial medial collateral  ligament was then elevated from anterior to posterior along the proximal  flare of the tibia and anterior half of the menisci resected. The knee was hyperflexed exposing bone on bone arthritis. Peripheral and notch osteophytes as well as the cruciate ligaments were then resected. We continued to  work our way around posteriorly along the proximal tibia, and externally  rotated the tibia subluxing it out from underneath the femur. A McHale  retractor was placed through the notch and a lateral Hohmann retractor  placed, and we then drilled through the proximal tibia in line with the  axis of the tibia followed by an intramedullary guide rod and 2-degree  posterior slope cutting guide. The tibial cutting guide was pinned into place  allowing resection of 6 mm of bone medially and about 4 mm of bone  laterally because of her valgus deformity. Satisfied with the tibial resection, we then  entered the distal femur 2 mm anterior to the PCL origin with the  intramedullary guide rod and applied the distal femoral cutting guide  set at 11mm, with 5 degrees of valgus. This was pinned along the  epicondylar axis. At this point, the distal femoral cut was accomplished without difficulty. We then sized for a #3 femoral component and pinned the guide in 3 degrees of external rotation.The chamfer cutting  guide was pinned into place. The anterior, posterior, and chamfer cuts were accomplished without difficulty followed by  the Sigma RP box cutting guide and the box cut. We also removed posterior osteophytes from the posterior femoral condyles. At this  time, the knee was brought into full extension. We checked our  extension and flexion gaps and found them symmetric at 12.83mm.  The patella thickness measured at 21 mm. We set the cutting guide at 13 and removed the posterior 8 mm  of the patella sized for 32 button and drilled the lollipop. The knee  was then once again hyperflexed exposing the proximal tibia. We sized for a #3 tibial base plate, applied the smokestack and the conical reamer followed by the the Delta fin keel punch. We then hammered into place the Sigma RP trial femoral component, inserted a 12.5-mm trial bearing, trial patellar button, and took the knee through range of motion from 0-130 degrees. No thumb pressure was required for patellar  tracking. At this point, all trial components were removed, a double batch of DePuy HV cement  was mixed and applied to all bony metallic mating surfaces except for the posterior condyles of the femur itself. In order, we  hammered into place the tibial tray and removed excess cement, the femoral component and removed excess cement, a 12.5-mm Sigma RP bearing  was inserted, and the knee brought to full extension with compression.  The patellar button was clamped into place, and excess cement  removed. While the cement cured the wound was irrigated out with normal saline solution pulse lavage, and medium Hemovac drains were placed.. Ligament stability and patellar tracking were checked and found to be excellent. The tourniquet was then released and hemostasis was obtained with cautery. The parapatellar arthrotomy was closed with  #1 ethibond suture. The subcutaneous tissue with 0 and 2-0 undyed  Vicryl suture, and 4-0 Monocryl.. A dressing of Xeroform,   4 x 4, dressing sponges, Webril, and Ace wrap applied. Needle and sponge count were correct times 2.The patient awakened, extubated, and taken to recovery room without difficulty. Vascular status was normal, pulses 2+ and symmetric.   Garyn Waguespack A 06/02/2014, 10:23 AM

## 2014-06-02 NOTE — Anesthesia Preprocedure Evaluation (Addendum)
Anesthesia Evaluation  Patient identified by MRN, date of birth, ID band Patient awake    Reviewed: Allergy & Precautions, H&P , NPO status , Patient's Chart, lab work & pertinent test results, reviewed documented beta blocker date and time   Airway Mallampati: II  TM Distance: >3 FB Neck ROM: Full    Dental no notable dental hx. (+) Teeth Intact, Dental Advisory Given   Pulmonary shortness of breath, COPD COPD inhaler,  breath sounds clear to auscultation  Pulmonary exam normal       Cardiovascular hypertension, Pt. on medications and Pt. on home beta blockers Rhythm:Regular Rate:Normal     Neuro/Psych negative neurological ROS  negative psych ROS   GI/Hepatic Neg liver ROS, GERD-  Controlled and Medicated,  Endo/Other  Hypothyroidism Morbid obesity  Renal/GU negative Renal ROS  negative genitourinary   Musculoskeletal  (+) Arthritis -, Osteoarthritis,    Abdominal   Peds  Hematology negative hematology ROS (+)   Anesthesia Other Findings   Reproductive/Obstetrics negative OB ROS                            Anesthesia Physical Anesthesia Plan  ASA: III  Anesthesia Plan: MAC, Spinal and Regional   Post-op Pain Management: MAC Combined w/ Regional for Post-op pain   Induction: Intravenous  Airway Management Planned: Simple Face Mask  Additional Equipment:   Intra-op Plan:   Post-operative Plan:   Informed Consent: I have reviewed the patients History and Physical, chart, labs and discussed the procedure including the risks, benefits and alternatives for the proposed anesthesia with the patient or authorized representative who has indicated his/her understanding and acceptance.   Dental advisory given  Plan Discussed with: CRNA and Surgeon  Anesthesia Plan Comments:        Anesthesia Quick Evaluation

## 2014-06-03 ENCOUNTER — Inpatient Hospital Stay (HOSPITAL_COMMUNITY): Payer: Medicare Other

## 2014-06-03 ENCOUNTER — Encounter (HOSPITAL_COMMUNITY): Payer: Self-pay | Admitting: Orthopedic Surgery

## 2014-06-03 LAB — BASIC METABOLIC PANEL
ANION GAP: 5 (ref 5–15)
BUN: 16 mg/dL (ref 6–23)
CALCIUM: 8.4 mg/dL (ref 8.4–10.5)
CHLORIDE: 104 mmol/L (ref 96–112)
CO2: 28 mmol/L (ref 19–32)
Creatinine, Ser: 1.19 mg/dL — ABNORMAL HIGH (ref 0.50–1.10)
GFR calc Af Amer: 52 mL/min — ABNORMAL LOW (ref >90)
GFR, EST NON AFRICAN AMERICAN: 45 mL/min — AB (ref >90)
Glucose, Bld: 192 mg/dL — ABNORMAL HIGH (ref 70–99)
POTASSIUM: 4.6 mmol/L (ref 3.5–5.1)
Sodium: 137 mmol/L (ref 135–145)

## 2014-06-03 LAB — CBC
HCT: 33.9 % — ABNORMAL LOW (ref 36.0–46.0)
Hemoglobin: 11 g/dL — ABNORMAL LOW (ref 12.0–15.0)
MCH: 29.1 pg (ref 26.0–34.0)
MCHC: 32.4 g/dL (ref 30.0–36.0)
MCV: 89.7 fL (ref 78.0–100.0)
Platelets: 173 10*3/uL (ref 150–400)
RBC: 3.78 MIL/uL — ABNORMAL LOW (ref 3.87–5.11)
RDW: 13.7 % (ref 11.5–15.5)
WBC: 10.1 10*3/uL (ref 4.0–10.5)

## 2014-06-03 MED ORDER — TIOTROPIUM BROMIDE MONOHYDRATE 18 MCG IN CAPS
18.0000 ug | ORAL_CAPSULE | Freq: Every day | RESPIRATORY_TRACT | Status: DC
  Administered 2014-06-04: 18 ug via RESPIRATORY_TRACT
  Filled 2014-06-03 (×2): qty 5

## 2014-06-03 MED ORDER — ALBUTEROL SULFATE (2.5 MG/3ML) 0.083% IN NEBU
2.5000 mg | INHALATION_SOLUTION | Freq: Four times a day (QID) | RESPIRATORY_TRACT | Status: DC
  Administered 2014-06-03 – 2014-06-04 (×4): 2.5 mg via RESPIRATORY_TRACT
  Filled 2014-06-03 (×5): qty 3

## 2014-06-03 NOTE — Evaluation (Signed)
Occupational Therapy Evaluation Patient Details Name: Valerie Beck MRN: [id number] DOB: 1941-04-27 Today's Date: 06/03/2014    History of Present Illness Patient is a 74 yo female admitted 06/02/14 now s/p Lt TKA.  PMH:  OA, COPD, HTN, Thyroid disease   Clinical Impression   Pt admitted with the above diagnoses and presents with below problem list. Pt will benefit from continued acute OT to address the below listed deficits and maximize independence with basic ADLs prior to d/c home with family. PTA pt mod I with ADLs. Pt currently at min A level for LB ADLs. ADL education provided. Acute OT to continue to follow.      Follow Up Recommendations  Supervision/Assistance - 24 hour;No OT follow up    Equipment Recommendations  Other (comment);None recommended by OT (pt has recommended equipment)    Recommendations for Other Services       Precautions / Restrictions Precautions Precautions: Knee Precaution Comments: reviewed with pt and daughter Restrictions Weight Bearing Restrictions: Yes LLE Weight Bearing: Weight bearing as tolerated      Mobility Bed Mobility Overal bed mobility: Needs Assistance Bed Mobility: Supine to Sit     Supine to sit: Supervision Sit to supine: Min assist   General bed mobility comments: no physical assust to LLE   Transfers Overall transfer level: Needs assistance Equipment used: Rolling walker (2 wheeled) Transfers: Sit to/from Stand Sit to Stand: Min assist         General transfer comment: min A for balance    Balance Overall balance assessment: Needs assistance         Standing balance support: Bilateral upper extremity supported;During functional activity Standing balance-Leahy Scale: Poor Standing balance comment: needs rw for balance; LOB when attempting LB dressing in sit<>stand                            ADL Overall ADL's : Needs assistance/impaired Eating/Feeding: Set up;Sitting   Grooming: Set  up;Sitting   Upper Body Bathing: Set up;Sitting   Lower Body Bathing: Minimal assistance;Sit to/from stand   Upper Body Dressing : Set up;Sitting   Lower Body Dressing: Minimal assistance;Sit to/from stand   Toilet Transfer: Min guard;Ambulation;RW   Toileting- Clothing Manipulation and Hygiene: Minimal assistance;Sit to/from stand   Tub/ Shower Transfer: Min guard;3 in [place] walker;Ambulation   Functional mobility during ADLs: Min guard;Rolling walker General ADL Comments: min A for LB ADLs due to decreased dynamic balance. ADL education provided to pt and daughter     Vision                     Perception     Praxis      Pertinent Vitals/Pain Pain Assessment: No/denies pain Pain Score: 7  Pain Location: Lt knee Pain Descriptors / Indicators: Aching Pain Intervention(s): Monitored during session     Hand Dominance     Extremity/Trunk Assessment Upper Extremity Assessment Upper Extremity Assessment: Overall WFL for tasks assessed   Lower Extremity Assessment Lower Extremity Assessment: Defer to PT evaluation       Communication Communication Communication: No difficulties   Cognition Arousal/Alertness: Awake/alert Behavior During Therapy: WFL for tasks assessed/performed Overall Cognitive Status: Within Functional Limits for tasks assessed                     General Comments   Pt c/o nausea "woozy"; suspect meds    Exercises  Shoulder Instructions      Home Living Family/patient expects to be discharged to:: Private residence Living Arrangements: Spouse/significant other Available Help at Discharge: Family;Available 24 hours/day Type of Home: House Home Access: Stairs to enter [employer] CorporationEntrance Stairs-Number of Steps: 3 Entrance Stairs-Rails: Right Home Layout: One level     Bathroom Shower/Tub: Chief Strategy OfficerTub/shower unit   Bathroom Toilet: Handicapped height Bathroom Accessibility: Yes How Accessible: Accessible via walker Home  Equipment: Walker - 2 wheels;Cane - single point;Bedside commode;Toilet riser;Shower seat;Hand held shower head          Prior Functioning/Environment Level of Independence: Independent with assistive device(s)        Comments: Using cane pta    OT Diagnosis: Acute pain   OT Problem List: Impaired balance (sitting and/or standing);Decreased knowledge of use of DME or AE;Decreased knowledge of precautions;Pain;Obesity   OT Treatment/Interventions: Self-care/ADL training;DME and/or AE instruction;Patient/family education;Balance training;Therapeutic activities    OT Goals(Current goals can be found in the care plan section) Acute Rehab OT Goals OT Goal Formulation: With patient/family Time For Goal Achievement: 06/10/14 Potential to Achieve Goals: Good ADL Goals Pt Will Perform Lower Body Bathing: with supervision;with adaptive equipment;sit to/from stand Pt Will Perform Lower Body Dressing: with supervision;with adaptive equipment;sit to/from stand Pt Will Transfer to Toilet: with supervision;ambulating (3n1 over toilet) Pt Will Perform Toileting - Clothing Manipulation and hygiene: with supervision;sit to/from stand  OT Frequency: Min 2X/week   Barriers to D/C:            Co-evaluation              End of Session Equipment Utilized During Treatment: Rolling walker CPM Left Knee CPM Left Knee: off Left Knee Flexion (Degrees): 90  Activity Tolerance: Patient tolerated treatment well Patient left: in chair;with call bell/phone within reach;with family/visitor present   Time: 1320-1340 OT Time Calculation (min): 20 min Charges:  OT General Charges $OT Visit: 1 Procedure OT Evaluation $Initial OT Evaluation Tier I: 1 Procedure OT Treatments $Self Care/Home Management : 8-22 mins G-Codes:    Pilar GrammesMathews, Storm Sovine H 06/03/2014, 1:55 PM

## 2014-06-03 NOTE — Discharge Instructions (Signed)
Information on my medicine - ELIQUIS® (apixaban) ° °This medication education was reviewed with me or my healthcare representative as part of my discharge preparation.  The pharmacist that spoke with me during my hospital stay was:  Felicha Frayne Poteet, RPH ° °Why was Eliquis® prescribed for you? °Eliquis® was prescribed for you to reduce the risk of blood clots forming after orthopedic surgery.   ° °What do You need to know about Eliquis®? °Take your Eliquis® TWICE DAILY - one tablet in the morning and one tablet in the evening with or without food.  It would be best to take the dose about the same time each day. ° °If you have difficulty swallowing the tablet whole please discuss with your pharmacist how to take the medication safely. ° °Take Eliquis® exactly as prescribed by your doctor and DO NOT stop taking Eliquis® without talking to the doctor who prescribed the medication.  Stopping without other medication to take the place of Eliquis® may increase your risk of developing a clot. ° °After discharge, you should have regular check-up appointments with your healthcare provider that is prescribing your Eliquis®. ° °What do you do if you miss a dose? °If a dose of ELIQUIS® is not taken at the scheduled time, take it as soon as possible on the same day and twice-daily administration should be resumed.  The dose should not be doubled to make up for a missed dose.  Do not take more than one tablet of ELIQUIS at the same time. ° °Important Safety Information °A possible side effect of Eliquis® is bleeding. You should call your healthcare provider right away if you experience any of the following: °? Bleeding from an injury or your nose that does not stop. °? Unusual colored urine (red or dark brown) or unusual colored stools (red or black). °? Unusual bruising for unknown reasons. °? A serious fall or if you hit your head (even if there is no bleeding). ° °Some medicines may interact with Eliquis® and might increase  your risk of bleeding or clotting while on Eliquis®. To help avoid this, consult your healthcare provider or pharmacist prior to using any new prescription or non-prescription medications, including herbals, vitamins, non-steroidal anti-inflammatory drugs (NSAIDs) and supplements. ° °This website has more information on Eliquis® (apixaban): http://www.eliquis.com/eliquis/home °

## 2014-06-03 NOTE — Progress Notes (Signed)
OT Cancellation Note  Patient Details Name: Margaretha SheffieldLynda C Kulig MRN: [id number] DOB: 08-06-1940   Cancelled Treatment:    Reason Eval/Treat Not Completed: Patient at procedure or test/ unavailable Pt having chest x-ray. Acute OT to reattempt.  Pilar GrammesMathews, Nusayba Cadenas H 06/03/2014, 9:29 AM

## 2014-06-03 NOTE — Progress Notes (Signed)
Physical Therapy Treatment Note  Clinical Impression: Pt not feeling well this afternoon requiring seated rest break during ambulation. Pt con't to progress well towards all goals despite whooziness. Acute PT to con't to follow to prepare for d/c.    06/03/14 1538  PT Visit Information  Last PT Received On 06/03/14  Assistance Needed +1  History of Present Illness Patient is a 74 yo female admitted 06/02/14 now s/p Lt TKA.  PMH:  OA, COPD, HTN, Thyroid disease  PT Time Calculation  PT Start Time (ACUTE ONLY) 1538  PT Stop Time (ACUTE ONLY) 1555  PT Time Calculation (min) (ACUTE ONLY) 17 min  Subjective Data  Subjective "I feel kind of whoozy and sleepy"  Precautions  Precautions Knee  Restrictions  Weight Bearing Restrictions Yes  LLE Weight Bearing WBAT  Pain Assessment  Pain Assessment 0-10  Pain Score 5  Pain Location Lt knee  Pain Descriptors / Indicators Aching  Pain Intervention(s) Monitored during session  Cognition  Arousal/Alertness Awake/alert  Behavior During Therapy WFL for tasks assessed/performed  Overall Cognitive Status Within Functional Limits for tasks assessed  Bed Mobility  Overal bed mobility Needs Assistance  Bed Mobility Sit to Supine  Sit to supine Supervision  General bed mobility comments no physical assist to LLE   Transfers  Overall transfer level Needs assistance  Equipment used Rolling walker (2 wheeled)  Transfers Sit to/from Stand  Sit to Stand Min guard  General transfer comment pt demo'd good technique  Ambulation/Gait  Ambulation/Gait assistance Min guard  Ambulation Distance (Feet) 230 Feet  Assistive device Rolling walker (2 wheeled)  Gait Pattern/deviations Step-through pattern  General Gait Details pt had to have 1 seated rest break due to feeling of "whooziness" and instability but otherwise had no episodes of Lt knee isntability  Exercises  Exercises Total Joint  Total Joint Exercises  Ankle Circles/Pumps AROM;Both;10  reps;Seated  Heel Slides AROM;Left;5 reps;Seated  Long Arc Quad AROM;Left;10 reps;Standing  Goniometric ROM 8-67  PT - End of Session  Equipment Utilized During Treatment Gait belt  Activity Tolerance Patient tolerated treatment well  Patient left in bed;in CPM;with call bell/phone within reach;with family/visitor present  Nurse Communication Mobility status  PT - Assessment/Plan  PT Plan Current plan remains appropriate  PT Frequency (ACUTE ONLY) 7X/week  Follow Up Recommendations Home health PT;Supervision/Assistance - 24 hour  PT equipment None recommended by PT  PT Goal Progression  Progress towards PT goals Progressing toward goals  PT General Charges  $$ ACUTE PT VISIT 1 Procedure  PT Treatments  $Gait Training 8-22 mins   Lewis ShockAshly Dhanvi Boesen, PT, DPT Pager #: [phone] Office #: [phone]

## 2014-06-03 NOTE — Progress Notes (Signed)
06[facility]03/14 [facility].Spoke with patient, no change in d/c plan. Patient states that she will have family to assist after discharge. [employer] providing CPM, rolling walker and 3N1.Will continue to follow until d/c.

## 2014-06-03 NOTE — Progress Notes (Signed)
Physical Therapy Treatment Patient Details Name: Margaretha SheffieldLynda C Viles MRN: [id number] DOB: 1941/01/20 Today's Date: 06/03/2014    History of Present Illness Patient is a 74 yo female admitted 06/02/14 now s/p Lt TKA.  PMH:  OA, COPD, HTN, Thyroid disease    PT Comments    Pt progressing well with significant improvement in ambulation tolerance. Acute PT to follow to progress mobility as able.   Follow Up Recommendations  Home health PT;Supervision/Assistance - 24 hour     Equipment Recommendations  None recommended by PT    Recommendations for Other Services       Precautions / Restrictions Precautions Precautions: Knee Restrictions Weight Bearing Restrictions: Yes LLE Weight Bearing: Weight bearing as tolerated    Mobility  Bed Mobility Overal bed mobility: Needs Assistance Bed Mobility: Supine to Sit;Sit to Supine     Supine to sit: Supervision Sit to supine: Min assist   General bed mobility comments: instructed pt on long sit technique to mimic home, minA for L LE management back into bed  Transfers Overall transfer level: Needs assistance Equipment used: Rolling walker (2 wheeled) Transfers: Sit to/from Stand Sit to Stand: Min assist         General transfer comment: pt with good carry over of technique  Ambulation/Gait Ambulation/Gait assistance: Min guard Ambulation Distance (Feet): 200 Feet Assistive device: Rolling walker (2 wheeled)     Gait velocity interpretation: Below normal speed for age/gender General Gait Details: pt with fluid step through gait pattern with mild antalgia   Stairs            Wheelchair Mobility    Modified Rankin (Stroke Patients Only)       Balance                                    Cognition Arousal/Alertness: Awake/alert Behavior During Therapy: WFL for tasks assessed/performed Overall Cognitive Status: Within Functional Limits for tasks assessed                      Exercises  Total Joint Exercises Quad Sets: AROM;Left;10 reps    General Comments General comments (skin integrity, edema, etc.): pt assisted to bathroom, pt supevision for hygiene s/p tolieting      Pertinent Vitals/Pain Pain Assessment: 0-10 Pain Score: 7  Pain Location: Lt knee Pain Descriptors / Indicators: Aching Pain Intervention(s): Monitored during session    Home Living                      Prior Function            PT Goals (current goals can now be found in the care plan section) Progress towards PT goals: Progressing toward goals    Frequency  7X/week    PT Plan Current plan remains appropriate    Co-evaluation             End of Session Equipment Utilized During Treatment: Gait belt Activity Tolerance: Patient tolerated treatment well Patient left: in chair;with call bell/phone within reach;with family/visitor present;in CPM     Time: [id number] PT Time Calculation (min) (ACUTE ONLY): 26 min  Charges:  $Gait Training: 23-37 mins                    G Codes:      Marcene BrawnChadwell, Terese Heier Marie 06/03/2014, 12:48 PM   Lewis ShockAshly Malon Siddall, PT, DPT Pager #:  [phone] Office #: [phone]

## 2014-06-03 NOTE — Progress Notes (Signed)
Subjective: 1 Day Post-Op Procedure(s) (LRB): TOTAL KNEE ARTHROPLASTY (Left) Patient reports pain as 7 on 0-10 scale.   Chest congestion with O2 sats in high 80s and 90s. Objective: Vital signs in last 24 hours: Temp:  [97.5 F (36.4 C)-98.4 F (36.9 C)] 97.9 F (36.6 C) (02/02 0600) Pulse Rate:  [61-86] 75 (02/02 0600) Resp:  [8-19] 16 (02/02 0600) BP: (124-168)/(40-143) 142/57 mmHg (02/02 0600) SpO2:  [93 %-100 %] 96 % (02/02 0600)  Intake/Output from previous day: 02/01 0701 - 02/02 0700 In: 2180 [P.O.:480; I.V.:1700] Out: 1105 [Urine:705; Drains:350; Blood:50] Intake/Output this shift:     Recent Labs  06/03/14 0623  HGB 11.0*    Recent Labs  06/03/14 0623  WBC 10.1  RBC 3.78*  HCT 33.9*  PLT 173    Recent Labs  06/03/14 0623  NA 137  K 4.6  CL 104  CO2 28  BUN 16  CREATININE 1.19*  GLUCOSE 192*  CALCIUM 8.4   No results for input(s): LABPT, INR in the last 72 hours.  ABD soft Neurovascular intact Sensation intact distally Intact pulses distally Dorsiflexion/Plantar flexion intact Incision: dressing C/D/I  Assessment/Plan: 1 Day Post-Op Procedure(s) (LRB): TOTAL KNEE ARTHROPLASTY (Left) Advance diet Up with therapy  Continue IV fluids until patient is voiding well.  Chest Xray.  Start Spiriva and Albuterol nebulizer q 6 hrs scheduled.  Inza Mikrut J 06/03/2014, 8:11 AM

## 2014-06-04 DIAGNOSIS — J441 Chronic obstructive pulmonary disease with (acute) exacerbation: Secondary | ICD-10-CM | POA: Diagnosis not present

## 2014-06-04 LAB — BASIC METABOLIC PANEL
Anion gap: 8 (ref 5–15)
BUN: 25 mg/dL — AB (ref 6–23)
CO2: 27 mmol/L (ref 19–32)
CREATININE: 1.38 mg/dL — AB (ref 0.50–1.10)
Calcium: 8.7 mg/dL (ref 8.4–10.5)
Chloride: 100 mmol/L (ref 96–112)
GFR calc Af Amer: 43 mL/min — ABNORMAL LOW (ref >90)
GFR calc non Af Amer: 37 mL/min — ABNORMAL LOW (ref >90)
GLUCOSE: 210 mg/dL — AB (ref 70–99)
Potassium: 4.7 mmol/L (ref 3.5–5.1)
SODIUM: 135 mmol/L (ref 135–145)

## 2014-06-04 LAB — CBC
HEMATOCRIT: 31.5 % — AB (ref 36.0–46.0)
Hemoglobin: 10.1 g/dL — ABNORMAL LOW (ref 12.0–15.0)
MCH: 29.2 pg (ref 26.0–34.0)
MCHC: 32.1 g/dL (ref 30.0–36.0)
MCV: 91 fL (ref 78.0–100.0)
PLATELETS: 179 10*3/uL (ref 150–400)
RBC: 3.46 MIL/uL — AB (ref 3.87–5.11)
RDW: 13.8 % (ref 11.5–15.5)
WBC: 11.9 10*3/uL — ABNORMAL HIGH (ref 4.0–10.5)

## 2014-06-04 MED ORDER — APIXABAN 2.5 MG PO TABS: ORAL_TABLET | ORAL | Status: DC

## 2014-06-04 MED ORDER — AZITHROMYCIN 250 MG PO TABS: ORAL_TABLET | ORAL | Status: DC

## 2014-06-04 MED ORDER — TIOTROPIUM BROMIDE MONOHYDRATE 18 MCG IN CAPS: 18.0000 ug | ORAL_CAPSULE | Freq: Every day | RESPIRATORY_TRACT | Status: DC

## 2014-06-04 MED ORDER — CELECOXIB 200 MG PO CAPS: ORAL_CAPSULE | ORAL | Status: DC

## 2014-06-04 MED ORDER — AZITHROMYCIN 500 MG PO TABS
500.0000 mg | ORAL_TABLET | Freq: Once | ORAL | Status: AC
  Administered 2014-06-04: 500 mg via ORAL
  Filled 2014-06-04: qty 1

## 2014-06-04 MED ORDER — OXYCODONE HCL 5 MG PO TABS: ORAL_TABLET | ORAL | Status: DC

## 2014-06-04 MED ORDER — ALBUTEROL SULFATE (2.5 MG/3ML) 0.083% IN NEBU: 2.5000 mg | INHALATION_SOLUTION | Freq: Two times a day (BID) | RESPIRATORY_TRACT | Status: DC

## 2014-06-04 MED ORDER — BISACODYL 10 MG RE SUPP
10.0000 mg | Freq: Every day | RECTAL | Status: DC | PRN
  Administered 2014-06-04: 10 mg via RECTAL
  Filled 2014-06-04: qty 1

## 2014-06-04 MED ORDER — POLYETHYLENE GLYCOL 3350 17 G PO PACK: PACK | ORAL | Status: DC

## 2014-06-04 NOTE — Progress Notes (Signed)
Physical Therapy Treatment Note  Clinical Impression: Pt progressing well and was able to amb entire unit without rest and good gait pattern.    06/04/14 1340  PT Visit Information  Last PT Received On 06/04/14  Assistance Needed +1  History of Present Illness Patient is a 74 yo female admitted 06/02/14 now s/p Lt TKA.  PMH:  OA, COPD, HTN, Thyroid disease  PT Time Calculation  PT Start Time (ACUTE ONLY) 1340  PT Stop Time (ACUTE ONLY) 1352  PT Time Calculation (min) (ACUTE ONLY) 12 min  Subjective Data  Subjective "I"ve decided to go home"  Precautions  Precautions Knee  Restrictions  Weight Bearing Restrictions Yes  LLE Weight Bearing WBAT  Pain Assessment  Pain Assessment 0-10  Pain Score 3  Pain Location L knee   Pain Descriptors / Indicators Aching  Pain Intervention(s) Monitored during session  Cognition  Arousal/Alertness Awake/alert  Behavior During Therapy WFL for tasks assessed/performed  Overall Cognitive Status Within Functional Limits for tasks assessed  Bed Mobility  General bed mobility comments pt up in chair  Transfers  Overall transfer level Needs assistance  Equipment used Rolling walker (2 wheeled)  Transfers Sit to/from Stand  Sit to Stand Supervision  General transfer comment Good technique. Supervision for safety.   Ambulation/Gait  Ambulation/Gait assistance Supervision  Ambulation Distance (Feet) 580 Feet  Assistive device Rolling walker (2 wheeled)  Gait Pattern/deviations Step-through pattern  General Gait Details encouraged pt to bend L knee during swing phase  insteady of circumduction. pt did not require seated rest break and was able to increased L LE WBing  Stairs Yes  Stairs assistance Supervision  Stair Management Two rails;Step to pattern  Number of Stairs 2  General stair comments pt with good demo of up with the good, down with the bad.  PT - End of Session  Equipment Utilized During Treatment Gait belt  Activity Tolerance Patient  tolerated treatment well  Patient left in chair;with call bell/phone within reach  Nurse Communication Mobility status  PT - Assessment/Plan  PT Plan Current plan remains appropriate  PT Frequency (ACUTE ONLY) 7X/week  Follow Up Recommendations Home health PT;Supervision/Assistance - 24 hour  PT equipment None recommended by PT  PT Goal Progression  Progress towards PT goals Progressing toward goals  PT General Charges  $$ ACUTE PT VISIT 1 Procedure  PT Treatments  $Gait Training 8-22 mins    Lewis ShockAshly Ena Demary, PT, DPT Pager #: [phone] Office #: [phone]

## 2014-06-04 NOTE — Progress Notes (Signed)
Patient reported that she was shaking and felt that her heart rate was high.  Upon examination, her heart rate was 119, I rechecked her pulse and it was 118.  I asked a few questions and she reported that when she was on the steroids at the doctor's office that she experienced these same symptoms.  She has requested that she not be given any more steroids if possible.  She is due to have Decadron at 8:00 AM and request that no more be given.  I told her I would forward this to the next shift.

## 2014-06-04 NOTE — Discharge Summary (Signed)
Patient ID: Valerie Beck MRN: [id number] DOB/AGE: 72-May-1942 72 y.o.  Admit date: 06/02/2014 Discharge date: 06/04/2014  Admission Diagnoses:  Principal Problem:   Primary localized osteoarthritis of left knee Active Problems:   Hypertension   Reflux   Thyroid disease   Dyspnea   Morbid obesity   COPD (chronic obstructive pulmonary disease)   DJD (degenerative joint disease) of knee   Discharge Diagnoses:  Same  Past Medical History  Diagnosis Date  . Endometrial polyp 07/2005    HYSTEROSCOPY, D&C  . Hypertension   . Reflux   . Thyroid disease     Hypothyroid  . Urinary incontinence   . Morbid obesity 03/25/2013  . COPD (chronic obstructive pulmonary disease)   . Primary localized osteoarthritis of left knee   . Dyspnea 03/25/2013    exertion - doesnt take spiriva every day  . Hypothyroidism   . GERD (gastroesophageal reflux disease)   . History of rectal bleeding     rectal tear related    Surgeries: Procedure(s): TOTAL KNEE ARTHROPLASTY on 06/02/2014   Consultants:    Discharged Condition: Improved  Hospital Course: Valerie Beck is an 74 y.o. female who was admitted 06/02/2014 for operative treatment ofPrimary localized osteoarthritis of left knee. Patient has severe unremitting pain that affects sleep, daily activities, and work/hobbies. After pre-op clearance the patient was taken to the operating room on 06/02/2014 and underwent  Procedure(s): TOTAL KNEE ARTHROPLASTY.    Patient was given perioperative antibiotics: Anti-infectives    Start     Dose/Rate Route Frequency Ordered Stop   06/02/14 1215  vancomycin (VANCOCIN) IVPB 1000 mg/200 mL premix     1,000 mg200 mL/hr over 60 Minutes Intravenous Every 12 hours 06/02/14 1203 06/02/14 1641   06/02/14 0600  vancomycin (VANCOCIN) 1,500 mg in sodium chloride 0.9 % 500 mL IVPB     1,500 mg250 mL/hr over 120 Minutes Intravenous On call to O.R. 06/01/14 1340 06/02/14 0922       Patient was given sequential  compression devices, early ambulation, and chemoprophylaxis to prevent DVT.  Patient benefited maximally from hospital stay and there were no complications.    Recent vital signs: Patient Vitals for the past 24 hrs:  BP Temp Temp src Pulse Resp SpO2  06/04/14 0943 - 98.9 F (37.2 C) Oral - - -  06/04/14 0821 - - - - 18 94 %  06/04/14 0749 - - - - - 96 %  06/04/14 0744 - - - - - 96 %  06/04/14 0550 (!) 123/40 mmHg 98.4 F (36.9 C) - 94 16 97 %  06/04/14 0400 - - - - 16 97 %  06/04/14 0136 - - - - - 100 %  06/04/14 0000 - - - - 16 97 %  06/03/14 2142 - - - - - 96 %  06/03/14 2119 (!) 141/47 mmHg 98.6 F (37 C) - 81 16 96 %  06/03/14 2000 - - - - 16 97 %  06/03/14 1254 (!) 134/54 mmHg 98.6 F (37 C) Oral 94 18 94 %     Recent laboratory studies:  Recent Labs  06/03/14 0623 06/04/14 0543  WBC 10.1 11.9*  HGB 11.0* 10.1*  HCT 33.9* 31.5*  PLT 173 179  NA 137 135  K 4.6 4.7  CL 104 100  CO2 28 27  BUN 16 25*  CREATININE 1.19* 1.38*  GLUCOSE 192* 210*  CALCIUM 8.4 8.7     Discharge Medications:     Medication List  STOP taking these medications        acetaminophen 650 MG CR tablet  Commonly known as:  TYLENOL     aspirin EC 81 MG tablet     predniSONE 10 MG tablet  Commonly known as:  STERAPRED UNI-PAK     VITAMIN B COMPLEX PO      TAKE these medications        apixaban 2.5 MG Tabs tablet  Commonly known as:  ELIQUIS  1 tablet twice a day to prevent blood clots     celecoxib 200 MG capsule  Commonly known as:  CELEBREX  1 tab po q day with food for pain and  swelling     docusate sodium 100 MG capsule  Commonly known as:  COLACE  Take 100 mg by mouth every evening.     furosemide 80 MG tablet  Commonly known as:  LASIX  Take 80 mg by mouth daily.     hydrocortisone cream 1 %  Apply 1 application topically daily.     levothyroxine 100 MCG tablet  Commonly known as:  SYNTHROID, LEVOTHROID  Take 100 mcg by mouth daily before breakfast.      metoprolol 100 MG tablet  Commonly known as:  LOPRESSOR  Take 100 mg by mouth every evening.     multivitamin tablet  Take 1 tablet by mouth daily.     omeprazole 20 MG capsule  Commonly known as:  PRILOSEC  Take 20 mg by mouth daily.     oxyCODONE 5 MG immediate release tablet  Commonly known as:  Oxy IR/ROXICODONE  1-2 tablets every 4-6 hrs as needed for pain     polyethylene glycol packet  Commonly known as:  MIRALAX / GLYCOLAX  1 packet twice a day until bowels are moving regularly     pravastatin 40 MG tablet  Commonly known as:  PRAVACHOL  Take 40 mg by mouth every evening.     tiotropium 18 MCG inhalation capsule  Commonly known as:  SPIRIVA HANDIHALER  Place 1 capsule (18 mcg total) into inhaler and inhale daily.     Vitamin D 2000 UNITS Caps  Take 2,000 Units by mouth daily.        Diagnostic Studies: Dg Chest 2 View  06/03/2014   CLINICAL DATA:  Hypoxia.  Recent knee replacement.  EXAM: CHEST  2 VIEW  COMPARISON:  None.  FINDINGS: Moderate cardiomegaly. Aortic and hilar contours are negative. Small bilateral pleural effusions are noted, partially filling the posterior costophrenic sulci. No edema, pneumonia, or segmental atelectasis.  IMPRESSION: 1. Trace bilateral pleural effusions. No edema, pneumonia, or collapse. 2. Cardiomegaly.   Electronically Signed   By: Tiburcio Pea M.D.   On: 06/03/2014 09:40    Disposition: Final discharge disposition not confirmed      Discharge Instructions    CPM    Complete by:  As directed   Continuous passive motion machine (CPM):      Use the CPM from 0 to 90 for 6 hours per day.       You may break it up into 2 or 3 sessions per day.      Use CPM for 2 weeks or until you are told to stop.     Call MD / Call 911    Complete by:  As directed   If you experience chest pain or shortness of breath, CALL 911 and be transported to the hospital emergency room.  If you develope a fever above  101 F, pus (white drainage) or  increased drainage or redness at the wound, or calf pain, call your surgeon's office.     Change dressing    Complete by:  As directed   Keep bandage over wound.  Wash whole leg including over the bandage every day with soap and water.     Constipation Prevention    Complete by:  As directed   Drink plenty of fluids.  Prune juice may be helpful.  You may use a stool softener, such as Colace (over the counter) 100 mg twice a day.  Use MiraLax (over the counter) for constipation as needed.     Diet - low sodium heart healthy    Complete by:  As directed      Discharge instructions    Complete by:  As directed   Total Knee Replacement Care After Refer to this sheet in the next few weeks. These discharge instructions provide you with general information on caring for yourself after you leave the hospital. Your caregiver may also give you specific instructions. Your treatment has been planned according to the most current medical practices available, but unavoidable complications sometimes occur. If you have any problems or questions after discharge, please call your caregiver. Regaining a near full range of motion of your knee within the first 3 to 6 weeks after surgery is critical. HOME CARE INSTRUCTIONS  You may resume a normal diet and activities as directed.  Perform exercises as directed.  Place gray foam block, curve side up under heel at all times except when in CPM or when walking.  DO NOT modify, tear, cut, or change in any way the gray foam block. You will receive physical therapy daily  Take showers instead of baths until informed otherwise.  You may shower on Friday.  Please wash whole leg including wound with soap and water over aquacel dressing.  DO NOT REMOVE AQUACEL DRESSING.  DR Thurston Hole WILL TAKE IT OFF IN THE OFFICE AT THE POST OP APPOINTMENT  It is OK to take over-the-counter tylenol in addition to the oxycodone for pain, discomfort, or fever. Oxycodone is VERY constipating.   Please take stool softener twice a day and laxatives daily until bowels are regular Eat a well-balanced diet.  Avoid lifting or driving until you are instructed otherwise.  Make an appointment to see your caregiver for stitches (suture) or staple removal as directed.  If you have been sent home with a continuous passive motion machine (CPM machine), 0-90 degrees 6 hrs a day   2 hrs a shift SEEK MEDICAL CARE IF: You have swelling of your calf or leg.  You develop shortness of breath or chest pain.  You have redness, swelling, or increasing pain in the wound.  There is pus or any unusual drainage coming from the surgical site.  You notice a bad smell coming from the surgical site or dressing.  The surgical site breaks open after sutures or staples have been removed.  There is persistent bleeding from the suture or staple line.  You are getting worse or are not improving.  You have any other questions or concerns.  SEEK IMMEDIATE MEDICAL CARE IF:  You have a fever.  You develop a rash.  You have difficulty breathing.  You develop any reaction or side effects to medicines given.  Your knee motion is decreasing rather than improving.  MAKE SURE YOU:  Understand these instructions.  Will watch your condition.  Will get help right away if  you are not doing well or get worse.     Do not put a pillow under the knee. Place it under the heel.    Complete by:  As directed   Place gray foam block, curve side up under heel at all times except when in CPM or when walking.  DO NOT modify, tear, cut, or change in any way the gray foam block.     Increase activity slowly as tolerated    Complete by:  As directed      TED hose    Complete by:  As directed   Use stockings (TED hose) for 2 weeks on both leg(s).  You may remove them at night for sleeping.           Follow-up Information    Follow up with Nilda Simmer, MD On 06/16/2014.   Specialty:  Orthopedic Surgery   Why:  appt time 3:30 pm    Contact information:   [address] [place] [address] [phone]       Follow up with [facility].   Why:  They will contact you to schedule home therapy visits.    Contact information:   [address] [place] [address] [phone]        Signed: Pascal Lux 06/04/2014, 10:21 AM

## 2014-06-04 NOTE — Progress Notes (Signed)
Physical Therapy Treatment Patient Details Name: Valerie SheffieldLynda C Beck MRN: [id number] DOB: 02-07-1941 Today's Date: 06/04/2014    History of Present Illness Patient is a 74 yo female admitted 06/02/14 now s/p Lt TKA.  PMH:  OA, COPD, HTN, Thyroid disease    PT Comments    Pt progressing well from mobility stand point however con't to get flush with increased HR into 120-130s during ambulation. Pt c/o "I just feel cruddy." Acute PT to con't to follow to progress mobility as able.  Follow Up Recommendations  Home health PT;Supervision/Assistance - 24 hour     Equipment Recommendations  None recommended by PT    Recommendations for Other Services       Precautions / Restrictions Precautions Precautions: Knee Restrictions Weight Bearing Restrictions: Yes LLE Weight Bearing: Weight bearing as tolerated    Mobility  Bed Mobility               General bed mobility comments: pt up in chair upon PT arrival  Transfers Overall transfer level: Needs assistance Equipment used: Rolling walker (2 wheeled) Transfers: Sit to/from Stand Sit to Stand: Supervision         General transfer comment: pt demo'd good technique  Ambulation/Gait Ambulation/Gait assistance: Min guard Ambulation Distance (Feet): 280 Feet (x2 (to from gym)) Assistive device: Rolling walker (2 wheeled) Gait Pattern/deviations: Step-through pattern     General Gait Details: pt became flush and asked to sit. Pt's HR 120-132, SpO2 at >92. Pt warm to touch. However pt con't to have smooth step through gait pattenr. encouraged to decreased UE WBing and increase L LE WBing.   Stairs Stairs: Yes Stairs assistance: Min guard Stair Management: Two rails;Step to pattern;Forwards Number of Stairs: 2 (to mimic home set up) General stair comments: pt with good demo of up with the good, down with the bad.  Wheelchair Mobility    Modified Rankin (Stroke Patients Only)       Balance                                    Cognition Arousal/Alertness: Awake/alert Behavior During Therapy: WFL for tasks assessed/performed Overall Cognitive Status: Within Functional Limits for tasks assessed                      Exercises Total Joint Exercises Ankle Circles/Pumps: AROM;Both;10 reps;Seated Quad Sets: AROM;Left;10 reps Heel Slides: AROM;Left;10 reps;Seated Long Arc Quad: AROM;Left;10 reps;Seated Goniometric ROM: 7-85    General Comments        Pertinent Vitals/Pain Pain Assessment: 0-10 Pain Score: 5  Pain Location: L knee Pain Descriptors / Indicators: Aching Pain Intervention(s): Monitored during session    Home Living                      Prior Function            PT Goals (current goals can now be found in the care plan section) Progress towards PT goals: Progressing toward goals    Frequency  7X/week    PT Plan Current plan remains appropriate    Co-evaluation             End of Session Equipment Utilized During Treatment: Gait belt Activity Tolerance: Patient tolerated treatment well Patient left: in chair;with call bell/phone within reach     Time: 7829-56210836-0911 PT Time Calculation (min) (ACUTE ONLY): 35 min  Charges:  $Gait  Training: 23-37 mins                    G Codes:      Marcene Brawn 06/04/2014, 10:11 AM   Lewis Shock, PT, DPT Pager #: [phone] Office #: [phone]

## 2014-06-04 NOTE — Progress Notes (Signed)
Occupational Therapy Treatment Patient Details Name: ARDELL AARONSON MRN: [id number] DOB: [birth year] Today's Date: 06/04/2014    History of present illness Patient is a 74 yo female admitted 06/02/14 now s/p Lt TKA.  PMH:  OA, COPD, HTN, Thyroid disease   OT comments  Pt seen today for ADL training. Pt finishing a sponge bath with NT and OT assisted pt in dressing to practice compensatory techniques. Pt requires Supervision for transfers and was able to don LB clothing with Supervision and assist for socks/shoes on LLE only. Educated pt on fall prevention and energy conservation. Pt plans to d/c home today and feel that she has met OT goals at this time. Acute OT to sign off.    Follow Up Recommendations  Supervision/Assistance - 24 hour;No OT follow up    Equipment Recommendations  None recommended by OT    Recommendations for Other Services      Precautions / Restrictions Precautions Precautions: Knee Precaution Comments: reviewed precautions Restrictions Weight Bearing Restrictions: Yes LLE Weight Bearing: Weight bearing as tolerated       Mobility Bed Mobility               General bed mobility comments: pt sitting up in recliner when OT arrived.   Transfers Overall transfer level: Needs assistance Equipment used: Rolling walker (2 wheeled) Transfers: Sit to/from Stand Sit to Stand: Supervision         General transfer comment: Good technique. Supervision for safety.         ADL Overall ADL's : Needs assistance/impaired     Grooming: Supervision/safety;Standing           Upper Body Dressing : Set up;Standing   Lower Body Dressing: Minimal assistance;Sit to/from stand Lower Body Dressing Details (indicate cue type and reason): (A) for LLE sock/shoes only. Pt able to don underwear and pants with Supervision.  Toilet Transfer: Supervision/safety;Ambulation;RW Toilet Transfer Details (indicate cue type and reason): sit<>stand from recliner          Functional mobility during ADLs: Supervision/safety;Rolling walker                  Cognition  Arousal/Alertness: Awake/Alert Behavior During Therapy: WFL for tasks assessed/performed Overall Cognitive Status: Within Functional Limits for tasks assessed                                    Pertinent Vitals/ Pain       Pain Assessment: 0-10 Pain Score: 3  Pain Location: L knee Pain Descriptors / Indicators: Aching Pain Intervention(s): Monitored during session         Frequency       Progress Toward Goals  OT Goals(current goals can now be found in the care plan section)  Progress towards OT goals: Goals met/education completed, patient discharged from [place] All goals met and education completed, patient discharged from OT services       End of Session Equipment Utilized During Treatment: Rolling walker   Activity Tolerance Patient tolerated treatment well   Patient Left in chair;with call bell/phone within reach   Nurse Communication          Time: 6767-2094 OT Time Calculation (min): 20 min  Charges: OT General Charges $OT Visit: 1 Procedure OT Treatments $Self Care/Home Management : 8-22 mins  Juluis Rainier 06/04/2014, 11:54 AM  Cyndie Chime, OTR/L Occupational Therapist [phone] (pager)

## 2014-10-27 ENCOUNTER — Other Ambulatory Visit: Payer: Self-pay

## 2015-07-06 ENCOUNTER — Other Ambulatory Visit: Payer: Self-pay | Admitting: Family Medicine

## 2015-07-06 ENCOUNTER — Other Ambulatory Visit: Payer: Self-pay | Admitting: Pediatric Anesthesia

## 2015-07-06 DIAGNOSIS — L989 Disorder of the skin and subcutaneous tissue, unspecified: Secondary | ICD-10-CM

## 2015-07-06 DIAGNOSIS — Z1231 Encounter for screening mammogram for malignant neoplasm of breast: Secondary | ICD-10-CM

## 2015-07-16 ENCOUNTER — Ambulatory Visit
Admission: RE | Admit: 2015-07-16 | Discharge: 2015-07-16 | Disposition: A | Discharge status: Home / Self Care | Payer: Medicare Other | Source: Ambulatory Visit | Attending: Family Medicine | Admitting: Family Medicine

## 2015-07-16 ENCOUNTER — Ambulatory Visit
Admission: RE | Admit: 2015-07-16 | Discharge: 2015-07-16 | Disposition: A | Discharge status: Home / Self Care | Payer: Medicare Other | Source: Ambulatory Visit | Attending: Pediatric Anesthesia | Admitting: Pediatric Anesthesia

## 2015-07-16 DIAGNOSIS — Z1231 Encounter for screening mammogram for malignant neoplasm of breast: Secondary | ICD-10-CM

## 2015-07-16 DIAGNOSIS — L989 Disorder of the skin and subcutaneous tissue, unspecified: Secondary | ICD-10-CM

## 2016-01-26 ENCOUNTER — Encounter: Payer: Self-pay | Admitting: Cardiology

## 2016-01-27 ENCOUNTER — Telehealth: Payer: Self-pay

## 2016-01-27 NOTE — Telephone Encounter (Signed)
Received surgical clearance from [facility].Dr.Jordan cleared patient for surgery.Form faxed back to fax # [phone]. 

## 2016-01-28 ENCOUNTER — Encounter: Payer: Self-pay | Admitting: Cardiology

## 2016-01-28 ENCOUNTER — Ambulatory Visit (INDEPENDENT_AMBULATORY_CARE_PROVIDER_SITE_OTHER): Payer: Medicare Other | Admitting: Cardiology

## 2016-01-28 VITALS — BP 110/60 | HR 70 | Ht 63.0 in | Wt 232.8 lb

## 2016-01-28 DIAGNOSIS — J449 Chronic obstructive pulmonary disease, unspecified: Secondary | ICD-10-CM | POA: Diagnosis not present

## 2016-01-28 DIAGNOSIS — E785 Hyperlipidemia, unspecified: Secondary | ICD-10-CM | POA: Diagnosis not present

## 2016-01-28 DIAGNOSIS — I10 Essential (primary) hypertension: Secondary | ICD-10-CM | POA: Diagnosis not present

## 2016-01-28 DIAGNOSIS — R9431 Abnormal electrocardiogram [ECG] [EKG]: Secondary | ICD-10-CM

## 2016-01-28 NOTE — Progress Notes (Signed)
Cardiology Office Note   Date:  01/28/2016   ID:  Kipp BroodLynda C Moree, DOB 15-Nov-1940, MRN [id number]  PCP:  Dois DavenportICHTER,KAREN L., MD  Cardiologist:  Dr. SwazilandJordan    Chief Complaint  Patient presents with  . Pre-op Exam      History of Present Illness: Margaretha SheffieldLynda C Millis is a 75 y.o. female who presents for review of EKG prior to Rt total knee.  She had new changes when seen by PCP.  Dr. SwazilandJordan had cleared her but then she developed EKG changes.    She has a history of hypertension, hyperlipidemia, and morbid obesity. She was seen one year ago for evaluation of dyspnea.  Echo showed mild LAE with normal LV function. Mild pulmonary HTN. Myoview study was normal. PFTs showed moderate COPD with some response to bronchodilators. She was started on Spiriva but didn't really notice much change. She no longer uses.  Today she has no complaints, her SOB overall has improved though she is still SOB with exertion. No chest pain. Her activity is decreased due to knee pain.  She has had more dizziness lately and skipped beats.     Recent LDL was 137 so pravastatin increased to 80 mg, she has not yet started her new dose.    Past Medical History:  Diagnosis Date  . COPD (chronic obstructive pulmonary disease) (HCC)   . Dyspnea 03/25/2013   exertion - doesnt take spiriva every day  . Endometrial polyp 07/2005   HYSTEROSCOPY, D&C  . GERD (gastroesophageal reflux disease)   . History of rectal bleeding    rectal tear related  . Hypertension   . Hypothyroidism   . Morbid obesity (HCC) 03/25/2013  . Primary localized osteoarthritis of left knee   . Reflux   . Thyroid disease    Hypothyroid  . Urinary incontinence     Past Surgical History:  Procedure Laterality Date  . EYE SURGERY Bilateral    cataracts  . HERNIA REPAIR     X 2  . HYSTEROSCOPY  10/2005   D&C, HYSTEROSCOPY FOR ENDOMETRIAL POLYPS.  Marland Kitchen. KNEE ARTHROSCOPY  1995  . TONSILLECTOMY    . TOTAL KNEE ARTHROPLASTY Left 06/02/2014   Procedure: TOTAL KNEE ARTHROPLASTY;  Surgeon: Nilda Simmerobert A [facility], MD;  Location: [facility] OR;  Service: Orthopedics;  Laterality: Left;  . TRIGGER FINGER RELEASE     LEFT THEN RIGHT IN 2012     Current Outpatient Prescriptions  Medication Sig Dispense Refill  . Cholecalciferol (VITAMIN D) 2000 UNITS CAPS Take 2,000 Units by mouth daily.    Marland Kitchen. docusate sodium (COLACE) 100 MG capsule Take 100 mg by mouth every evening.    . famotidine (PEPCID) 20 MG tablet Take 20 mg by mouth 2 (two) times daily.    . furosemide (LASIX) 80 MG tablet Take 80 mg by mouth daily.    . hydrocortisone cream 1 % Apply 1 application topically daily.    Marland Kitchen. levothyroxine (SYNTHROID, LEVOTHROID) 100 MCG tablet Take 100 mcg by mouth daily before breakfast.    . metoprolol (LOPRESSOR) 100 MG tablet Take 100 mg by mouth every evening.    . Multiple Vitamin (MULTIVITAMIN) tablet Take 1 tablet by mouth daily.    . pravastatin (PRAVACHOL) 40 MG tablet Take 40 mg by mouth every evening.     Marland Kitchen. apixaban (ELIQUIS) 2.5 MG TABS tablet Take 2.5 mg by mouth 2 (two) times daily.    . celecoxib (CELEBREX) 200 MG capsule Take 200 mg by mouth daily.    .Marland Kitchen  omeprazole (PRILOSEC) 20 MG capsule Take 20 mg by mouth daily.    Marland Kitchen oxyCODONE (OXY IR/ROXICODONE) 5 MG immediate release tablet 1-2 tablets every 4-6 hrs as needed for pain (Patient not taking: Reported on 01/28/2016) 100 tablet 0  . polyethylene glycol (MIRALAX / GLYCOLAX) packet 1 packet twice a day until bowels are moving regularly (Patient not taking: Reported on 01/28/2016) 60 each 0  . tiotropium (SPIRIVA HANDIHALER) 18 MCG inhalation capsule Place 1 capsule (18 mcg total) into inhaler and inhale daily. (Patient not taking: Reported on 01/28/2016) 30 capsule 6   No current facility-administered medications for this visit.    Pt is not on eliquis or celebrex.   Allergies:   Penicillins    Social History:  The patient  reports that she has never smoked. She has never used smokeless tobacco.  She reports that she does not drink alcohol or use drugs.   Family History:  The patient's family history includes Cancer in her brother and father; Diabetes in her father and sister; Heart attack in her brother; Heart disease in her brother and father; Heart failure in her mother; Hypertension in her brother, father, and mother; Kidney failure in her brother.    ROS:  General:no colds or fevers, no weight changes Skin:no rashes or ulcers HEENT:no blurred vision, no congestion CV:see HPI PUL:see HPI GI:no diarrhea constipation or melena, no indigestion GU:no hematuria, no dysuria MS:no joint pain, no claudication Neuro:no syncope, occ lightheadedness Endo:no diabetes, no thyroid disease  Wt Readings from Last 3 Encounters:  01/28/16 232 lb 12.8 oz (105.6 kg)  06/02/14 241 lb 10 oz (109.6 kg)  05/22/14 241 lb 10 oz (109.6 kg)     PHYSICAL EXAM: VS:  BP 110/60   Pulse 70   Ht 5\' 3"  (1.6 m)   Wt 232 lb 12.8 oz (105.6 kg)   BMI 41.24 kg/m  , BMI Body mass index is 41.24 kg/m. General:Pleasant affect, NAD Skin:Warm and dry, brisk capillary refill HEENT:normocephalic, sclera clear, mucus membranes moist Neck:supple, no JVD, no bruits  Heart:S1S2 RRR without murmur, gallup, rub or click Lungs:clear without rales, rhonchi, or wheezes ONG:EXBM, non tender, + BS, do not palpate liver spleen or masses Ext:no lower ext edema, 2+ pedal pulses, 2+ radial pulses Neuro:alert and oriented, MAE, follows commands, + facial symmetry    EKG:  EKG is ordered today. The ekg ordered today demonstrates SR with PACs, new T wave inversion in III.    This is similar to EKG with [facility].  Recent Labs: No results found for requested labs within last 8760 hours.    Lipid Panel No results found for: CHOL, TRIG, HDL, CHOLHDL, VLDL, LDLCALC, LDLDIRECT     Other studies Reviewed: Additional studies/ records that were reviewed today include: . Echo:Study Conclusions  -  Left ventricle: The cavity size was normal. Wall thickness was normal. Systolic function was normal. The estimated ejection fraction was in the range of 55% to 65%. Wall motion was normal; there were no regional wall motion abnormalities. - Left atrium: The atrium was mildly dilated. - Pulmonary arteries: Systolic pressure was mildly increased. PA peak pressure: 41mm Hg (S).  Cardiology Nuclear Med Study   Impression Exercise Capacity: Lexiscan with no exercise. BP Response: Normal blood pressure response. Clinical Symptoms: No significant symptoms noted. ECG Impression: No significant ST segment change suggestive of ischemia. Comparison with Prior Nuclear Study: No images to compare  Overall Impression: Normal stress nuclear study.  LV Ejection Fraction: Study  not gated. LV Wall Motion:NA  ASSESSMENT AND PLAN:  1.  Abnormal EKG  Will do lexiscan myoview. If normal she will be cleared for surgery. Then follow up PRN.  If not will discuss with Dr. Swaziland.  2.  HTN controlled  3. Hyperlipidemia on preavastatin now with new dose.  4. COPD but not using inhaler and SOB has improved.    Current medicines are reviewed with the patient today.  The patient Has no concerns regarding medicines.  The following changes have been made:  See above Labs/ tests ordered today include:see above  Disposition:   FU:  see above  Signed, Nada Boozer, NP  01/28/2016 4:23 PM    [facility] [address] [place], [place], [place]  [address]/ [address] [employer] [employer] [address] [place], [place] Phone: [phone]; Fax: [phone]  [phone]

## 2016-01-28 NOTE — Patient Instructions (Addendum)
Your physician recommends that you continue on your current medications as directed. Please refer to the Current Medication list given to you today.  Your physician recommends that you schedule a follow-up appointment in:  AS  NEEDED  Your physician has requested that you have a lexiscan myoview. For further information please visit [email or web address]. Please follow instruction sheet, as given.

## 2016-01-29 ENCOUNTER — Telehealth (HOSPITAL_COMMUNITY): Payer: Self-pay | Admitting: *Deleted

## 2016-01-29 NOTE — Telephone Encounter (Signed)
Left message on voicemail per DPR in reference to upcoming appointment scheduled on 02/01/16 at 12:30 with detailed instructions given per Myocardial Perfusion Study Information Sheet for the test. LM to arrive 15 minutes early, and that it is imperative to arrive on time for appointment to keep from having the test rescheduled. If you need to cancel or reschedule your appointment, please call the office within 24 hours of your appointment. Failure to do so may result in a cancellation of your appointment, and a $50 no show fee. Phone number given for call back for any questions.

## 2016-02-01 ENCOUNTER — Ambulatory Visit (HOSPITAL_COMMUNITY): Payer: Medicare Other | Attending: Cardiology

## 2016-02-01 DIAGNOSIS — R9431 Abnormal electrocardiogram [ECG] [EKG]: Secondary | ICD-10-CM | POA: Insufficient documentation

## 2016-02-01 MED ORDER — TECHNETIUM TC 99M TETROFOSMIN IV KIT
33.0000 | PACK | Freq: Once | INTRAVENOUS | Status: AC | PRN
  Administered 2016-02-01: 33 via INTRAVENOUS
  Filled 2016-02-01: qty 33

## 2016-02-01 MED ORDER — REGADENOSON 0.4 MG/5ML IV SOLN
0.4000 mg | Freq: Once | INTRAVENOUS | Status: AC
  Administered 2016-02-01: 0.4 mg via INTRAVENOUS

## 2016-02-02 ENCOUNTER — Ambulatory Visit (HOSPITAL_COMMUNITY): Payer: Medicare Other | Attending: Cardiovascular Disease

## 2016-02-02 LAB — MYOCARDIAL PERFUSION IMAGING
CHL CUP NUCLEAR SRS: 2
CHL CUP NUCLEAR SSS: 4
CHL CUP RESTING HR STRESS: 62 {beats}/min
LHR: 0.2
LV dias vol: 86 mL (ref 46–106)
LV sys vol: 21 mL
Peak HR: 83 {beats}/min
SDS: 2
TID: 0.87

## 2016-02-02 MED ORDER — TECHNETIUM TC 99M TETROFOSMIN IV KIT
30.4000 | PACK | Freq: Once | INTRAVENOUS | Status: AC | PRN
  Administered 2016-02-02: 30 via INTRAVENOUS
  Filled 2016-02-02: qty 30

## 2016-02-24 ENCOUNTER — Encounter (HOSPITAL_COMMUNITY): Payer: Self-pay | Admitting: Physician Assistant

## 2016-02-24 DIAGNOSIS — M1711 Unilateral primary osteoarthritis, right knee: Secondary | ICD-10-CM | POA: Diagnosis present

## 2016-02-24 DIAGNOSIS — K219 Gastro-esophageal reflux disease without esophagitis: Secondary | ICD-10-CM | POA: Diagnosis present

## 2016-02-24 NOTE — Pre-Procedure Instructions (Signed)
    Valerie SheffieldLynda C Beck  02/24/2016      [facility] - [place] - [address] [place] [address] Phone: [phone] Fax: [phone]  [facility] - [place] - [address] [address] [address] [place] [address] Phone: [phone] Fax: [phone]  [facility] [place] - [id number] [place] [address] [place] [address] Phone: [phone] Fax: [phone]    Your procedure is scheduled on Monday, November 6th   [facility] Admitting at 10:30 AM             (posted surgery time 12:29 pm - 2:42 pm)   Call this number if you have problems the MORNING of surgery:   [phone]   Remember:  Do not eat food or drink liquids after midnight Sunday.  Take these medicines the morning of surgery with A SIP OF WATER : Levothyroxine, Metoprolol, Pepcid, Flonase              4-5 days prior to surgery, STOP TAKING any herbal supplements, vitamins, anti-inflammatories.   Do not wear jewelry, make-up or nail polish.  Do not wear lotions, powders,perfumes, or deoderant.   Do not shave 48 hours prior to surgery.   Do not bring valuables to the hospital.  [facility] is not responsible for any belongings or valuables.  Contacts, dentures or bridgework may not be worn into surgery.  Leave your suitcase in the car.  After surgery it may be brought to your room. For patients admitted to the hospital, discharge time will be determined by your treatment team.  Please read over the following fact sheets that you were given. Pain Booklet, MRSA Information and Surgical Site Infection Prevention

## 2016-02-24 NOTE — H&P (Addendum)
TOTAL KNEE ADMISSION H&P  Patient is being admitted for right total knee arthroplasty.  Subjective:  Chief Complaint:right knee pain.  HPI: Valerie Beck, 75 y.o. female, has a history of pain and functional disability in the right knee due to arthritis and has failed non-surgical conservative treatments for greater than 12 weeks to includeNSAID's and/or analgesics, corticosteriod injections, viscosupplementation injections, flexibility and strengthening excercises, supervised PT with diminished ADL's post treatment, use of assistive devices, weight reduction as appropriate and activity modification.  Onset of symptoms was gradual, starting 10 years ago with gradually worsening course since that time. The patient noted prior procedures on the knee to include  arthroscopy on the right knee(s).  Patient currently rates pain in the right knee(s) at 10 out of 10 with activity. Patient has night pain, worsening of pain with activity and weight bearing, pain that interferes with activities of daily living, crepitus and joint swelling.  Patient has evidence of subchondral sclerosis, periarticular osteophytes and joint space narrowing by imaging studies.  There is no active infection.  Patient Active Problem List   Diagnosis Date Noted  . GERD (gastroesophageal reflux disease) 02/24/2016  . Primary localized osteoarthrosis of the knee, right   . Bronchitis, chronic obstructive, with exacerbation (HCC) 06/04/2014  . DJD (degenerative joint disease) of knee 06/02/2014  . 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 Primary localized osteoarthritis of left knee   . COPD (chronic obstructive pulmonary disease) (HCC)   . Dyspnea 03/25/2013  . Morbid obesity (HCC) 03/25/2013  . Hypertension   . Reflux   . Thyroid disease   . Endometrial polyp    Past Medical History:  Diagnosis Date  . COPD (chronic obstructive pulmonary disease) (HCC)   . Dyspnea 03/25/2013   exertion - doesnt take spiriva every  day  . Endometrial polyp 07/2005   HYSTEROSCOPY, D&C  . GERD (gastroesophageal reflux disease)   . History of rectal bleeding    rectal tear related  . Hypertension   . Hypothyroidism   . Morbid obesity (HCC) 03/25/2013  . Primary localized osteoarthritis of left knee   . Primary localized osteoarthrosis of the knee, right   . Reflux   . 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 Thyroid disease    Hypothyroid  . Urinary incontinence     Past Surgical History:  Procedure Laterality Date  . EYE SURGERY Bilateral    cataracts  . HERNIA REPAIR     X 2  . HYSTEROSCOPY  10/2005   D&C, HYSTEROSCOPY FOR ENDOMETRIAL POLYPS.  Marland Kitchen. KNEE ARTHROSCOPY  1995  . TONSILLECTOMY    . TOTAL KNEE ARTHROPLASTY Left 06/02/2014   Procedure: TOTAL KNEE ARTHROPLASTY;  Surgeon: Nilda Simmerobert A [facility]ainer, MD;  Location: [facility] OR;  Service: Orthopedics;  Laterality: Left;  . TRIGGER FINGER RELEASE     LEFT THEN RIGHT IN 2012    No prescriptions prior to admission.   Allergies  Allergen Reactions  . Penicillins     Caused pinpoint blisters to develop on the skin.  Has patient had a PCN reaction causing immediate rash, facial/tongue/throat swelling, SOB or lightheadedness with hypotension: No Has patient had a PCN reaction causing severe rash involving mucus membranes or skin necrosis: No Has patient had a PCN reaction that required hospitalization No Has patient had a PCN reaction occurring within the last 10 years: No If all of the above answers are "NO", then may proceed with Cephalosporin use.     Social History  Substance Use  Topics  . Smoking status: Never Smoker  . Smokeless tobacco: Never Used  . Alcohol use No    Family History  Problem Relation Age of Onset  . Cancer Father     MELANOMA AND THYROID CANCER  . Diabetes Father   . Hypertension Father   . Heart disease Father   . Diabetes Sister   . Hypertension Mother   . Heart failure Mother   . Hypertension Brother   . Heart disease  Brother   . Kidney failure Brother   . Heart attack Brother   . Cancer Brother     Lymphoma     Review of Systems  Constitutional: Negative.   HENT: Negative.   Eyes: Negative.   Respiratory: Negative.   Cardiovascular: Negative.   Gastrointestinal: Negative.   Genitourinary: Negative.   Musculoskeletal: Positive for back pain and joint pain.  Skin: Negative.   Neurological: Negative.   Endo/Heme/Allergies: Negative.   Psychiatric/Behavioral: Negative.     Objective:  Physical Exam  Constitutional: She is oriented to person, place, and time. She appears well-developed and well-nourished.  HENT:  Head: Normocephalic and atraumatic.  Eyes: Conjunctivae are normal. Pupils are equal, round, and reactive to light.  Neck: Neck supple.  Cardiovascular: Normal rate.   Respiratory: Effort normal.  GI: Soft.  Genitourinary:  Genitourinary Comments: Not pertinent to current symptomatology therefore not examined.  Musculoskeletal:    Examination of her right knee reveals valgus deformity.  1+ synovitis.  Diffuse pain.  Range of motion 0-110 degrees.  Knee is stable.  Examination of her left knee reveals well healed total knee incision without swelling or pain.  Full range of motion.  Knee is stable.  Vascular exam: Pulses are 2+ and symmetric  Neurological: She is alert and oriented to person, place, and time.  Skin: Skin is warm and dry.  Psychiatric: She has a normal mood and affect.    Vital signs in last 24 hours: Temp:  [97.8 F (36.6 C)] 97.8 F (36.6 C) (10/25 1500) Pulse Rate:  [68] 68 (10/25 1500) BP: (145)/(77) 145/77 (10/25 1500) SpO2:  [98 %] 98 % (10/25 1500) Weight:  [107 kg (236 lb)] 107 kg (236 lb) (10/25 1500)  Labs:   Estimated body mass index is 43.16 kg/m as calculated from the following:   Height as of this encounter: 5\' 2"  (1.575 m).   Weight as of this encounter: 107 kg (236 lb).   Imaging Review Plain radiographs demonstrate severe degenerative  joint disease of the right knee(s). The overall alignment issignificant varus. The bone quality appears to be good for age and reported activity level.  Assessment/Plan:  End stage arthritis, right knee  Principal Problem:   Primary localized osteoarthrosis of the knee, right Active Problems:   Hypertension   Thyroid disease   COPD (chronic obstructive pulmonary disease) (HCC)   Bronchitis, chronic obstructive, with exacerbation (HCC)   S/P total knee arthroplasty, left   GERD (gastroesophageal reflux disease)   The patient history, physical examination, clinical judgment of the provider and imaging studies are consistent with end stage degenerative joint disease of the right knee(s) and total knee arthroplasty is deemed medically necessary. The treatment options including medical management, injection therapy arthroscopy and arthroplasty were discussed at length. The risks and benefits of total knee arthroplasty were presented and reviewed. The risks due to aseptic loosening, infection, stiffness, patella tracking problems, thromboembolic complications and other imponderables were discussed. The patient acknowledged the explanation, agreed to proceed with the  plan and consent was signed. Patient is being admitted for inpatient treatment for surgery, pain control, PT, OT, prophylactic antibiotics, VTE prophylaxis, progressive ambulation and ADL's and discharge planning. The patient is planning to be discharged home with home health services

## 2016-02-25 ENCOUNTER — Encounter (HOSPITAL_COMMUNITY): Payer: Self-pay

## 2016-02-25 ENCOUNTER — Encounter (HOSPITAL_COMMUNITY)
Admission: RE | Admit: 2016-02-25 | Discharge: 2016-02-25 | Disposition: A | Discharge status: Home / Self Care | Payer: Medicare Other | Source: Ambulatory Visit | Attending: Orthopedic Surgery | Admitting: Orthopedic Surgery

## 2016-02-25 DIAGNOSIS — Z01812 Encounter for preprocedural laboratory examination: Secondary | ICD-10-CM | POA: Insufficient documentation

## 2016-02-25 DIAGNOSIS — M1711 Unilateral primary osteoarthritis, right knee: Secondary | ICD-10-CM | POA: Insufficient documentation

## 2016-02-25 DIAGNOSIS — Z01818 Encounter for other preprocedural examination: Secondary | ICD-10-CM | POA: Insufficient documentation

## 2016-02-25 DIAGNOSIS — E039 Hypothyroidism, unspecified: Secondary | ICD-10-CM | POA: Insufficient documentation

## 2016-02-25 DIAGNOSIS — Z96652 Presence of left artificial knee joint: Secondary | ICD-10-CM | POA: Diagnosis not present

## 2016-02-25 DIAGNOSIS — N289 Disorder of kidney and ureter, unspecified: Secondary | ICD-10-CM | POA: Diagnosis not present

## 2016-02-25 DIAGNOSIS — Z0183 Encounter for blood typing: Secondary | ICD-10-CM | POA: Diagnosis not present

## 2016-02-25 DIAGNOSIS — K219 Gastro-esophageal reflux disease without esophagitis: Secondary | ICD-10-CM | POA: Insufficient documentation

## 2016-02-25 DIAGNOSIS — Z79899 Other long term (current) drug therapy: Secondary | ICD-10-CM | POA: Insufficient documentation

## 2016-02-25 DIAGNOSIS — J449 Chronic obstructive pulmonary disease, unspecified: Secondary | ICD-10-CM | POA: Insufficient documentation

## 2016-02-25 DIAGNOSIS — I10 Essential (primary) hypertension: Secondary | ICD-10-CM | POA: Diagnosis not present

## 2016-02-25 HISTORY — DX: Personal history of other specified conditions: Z87.898

## 2016-02-25 HISTORY — DX: Disorder of kidney and ureter, unspecified: N28.9

## 2016-02-25 HISTORY — DX: Family history of other specified conditions: Z84.89

## 2016-02-25 LAB — COMPREHENSIVE METABOLIC PANEL
ALT: 14 U/L (ref 14–54)
AST: 25 U/L (ref 15–41)
Albumin: 3.7 g/dL (ref 3.5–5.0)
Alkaline Phosphatase: 60 U/L (ref 38–126)
Anion gap: 6 (ref 5–15)
BILIRUBIN TOTAL: 0.8 mg/dL (ref 0.3–1.2)
BUN: 22 mg/dL — AB (ref 6–20)
CALCIUM: 9.1 mg/dL (ref 8.9–10.3)
CO2: 28 mmol/L (ref 22–32)
CREATININE: 1.23 mg/dL — AB (ref 0.44–1.00)
Chloride: 103 mmol/L (ref 101–111)
GFR, EST AFRICAN AMERICAN: 49 mL/min — AB (ref >60)
GFR, EST NON AFRICAN AMERICAN: 42 mL/min — AB (ref >60)
Glucose, Bld: 107 mg/dL — ABNORMAL HIGH (ref 65–99)
Potassium: 3.8 mmol/L (ref 3.5–5.1)
Sodium: 137 mmol/L (ref 135–145)
TOTAL PROTEIN: 6.5 g/dL (ref 6.5–8.1)

## 2016-02-25 LAB — URINALYSIS, ROUTINE W REFLEX MICROSCOPIC
Bilirubin Urine: NEGATIVE
Glucose, UA: NEGATIVE mg/dL
Hgb urine dipstick: NEGATIVE
KETONES UR: NEGATIVE mg/dL
LEUKOCYTES UA: NEGATIVE
NITRITE: NEGATIVE
PH: 5.5 (ref 5.0–8.0)
Protein, ur: NEGATIVE mg/dL
SPECIFIC GRAVITY, URINE: 1.007 (ref 1.005–1.030)

## 2016-02-25 LAB — APTT: aPTT: 31 seconds (ref 24–36)

## 2016-02-25 LAB — CBC WITH DIFFERENTIAL/PLATELET
BASOS ABS: 0.1 10*3/uL (ref 0.0–0.1)
Basophils Relative: 1 %
EOS PCT: 3 %
Eosinophils Absolute: 0.2 10*3/uL (ref 0.0–0.7)
HEMATOCRIT: 40.4 % (ref 36.0–46.0)
Hemoglobin: 13 g/dL (ref 12.0–15.0)
LYMPHS ABS: 1.6 10*3/uL (ref 0.7–4.0)
LYMPHS PCT: 30 %
MCH: 29.1 pg (ref 26.0–34.0)
MCHC: 32.2 g/dL (ref 30.0–36.0)
MCV: 90.4 fL (ref 78.0–100.0)
MONO ABS: 0.7 10*3/uL (ref 0.1–1.0)
Monocytes Relative: 12 %
NEUTROS ABS: 2.9 10*3/uL (ref 1.7–7.7)
Neutrophils Relative %: 54 %
Platelets: 226 10*3/uL (ref 150–400)
RBC: 4.47 MIL/uL (ref 3.87–5.11)
RDW: 13.7 % (ref 11.5–15.5)
WBC: 5.4 10*3/uL (ref 4.0–10.5)

## 2016-02-25 LAB — TYPE AND SCREEN
ABO/RH(D): A POS
Antibody Screen: NEGATIVE

## 2016-02-25 LAB — SURGICAL PCR SCREEN
MRSA, PCR: NEGATIVE
Staphylococcus aureus: POSITIVE — AB

## 2016-02-25 LAB — PROTIME-INR
INR: 1.01
PROTHROMBIN TIME: 13.3 s (ref 11.4–15.2)

## 2016-02-25 NOTE — Progress Notes (Addendum)
PCP  Is Dr. Hal Hopeichter    [facility] for this up coming surgery. Dr. Hal Hopeichter thought there was some change from earlier one.  She was sent to have stress test, "normal" ( Oct. 2017)  She did see L. [place]old[place] "heart is fine"   Denies any chest pains, tho she does have palpitations -- she cannot feel them.

## 2016-02-25 NOTE — Progress Notes (Signed)
Mupirocin Ointment called into [facility] for positive PCR of Staph. Left message on pt's voicemail informing her of results and need to pick up Rx.

## 2016-02-26 LAB — URINE CULTURE

## 2016-02-26 NOTE — Progress Notes (Signed)
Anesthesia chart review: Patient is a 75 year old female scheduled for right TKR on 03/07/2016 by Dr. Thurston HoleWainer.  History includes nonsmoker, hypertension, hypothyroidism, GERD, COPD, DOE, palpitations, renal insufficiency, left TKA '16, tonsillectomy. BMI is consistent with morbid obesity.  PCP is listed as Dr. Nadyne CoombesKaren Richter. She was referred to Nada BoozerLaura Ingold, NP with Dr. Peter SwazilandJordan for pre-operative clearance. She was given cardiac clearance by Corine ShelterLuke Kilroy, PA-C following recent low risk stress test. PRN cardiology follow-up recommended.  Meds include Pepcid, Flonase, Lasix, levothyroxine, Claritin, Lopressor, pravastatin.  BP (!) 132/54   Pulse 66   Temp 36.6 C   Resp 20   Ht 5' 1.25" (1.556 m)   Wt 234 lb 3.2 oz (106.2 kg)   SpO2 97%   BMI 43.89 kg/m   01/28/16 EKG: SR with PACs, inferior infarct (age undetermined), cannot rule out anterior infarct (age undetermined).  02/02/16 Nuclear stress test:  Nuclear stress EF: 75%.  There was no ST segment deviation noted during stress.  The study is normal.  This is a low risk study.  The left ventricular ejection fraction is hyperdynamic (>65%).  02/11/13 Echo: Study Conclusions - Left ventricle: The cavity size was normal. Wall thickness was normal. Systolic function was normal. The estimated ejection fraction was in the range of 55% to 65%. Wall motion was normal; there were no regional wall motion abnormalities. - Left atrium: The atrium was mildly dilated. - Pulmonary arteries: Systolic pressure was mildly increased. PA peak pressure: 41mm Hg (S).  04/26/13 PFTs: FVC 1.86 (pre 67%, post 69%), FEV1 1.38 (pre 66%, post 73%), DLCOunc 19.60 (90%).  Preoperative labs noted. Urine culture showed multiple species present, suggest recollection.  If no acute changes then I would anticipate that she can proceed as planned.  Velna Ochsllison Oona Trammel, PA-C [facility]Anesthesiology Phone [phone] 02/26/2016  2:11 PM

## 2016-03-04 MED ORDER — VANCOMYCIN HCL 10 G IV SOLR
1500.0000 mg | INTRAVENOUS | Status: AC
  Administered 2016-03-07: 1500 mg via INTRAVENOUS
  Filled 2016-03-04: qty 1500

## 2016-03-05 ENCOUNTER — Encounter: Payer: Self-pay | Admitting: Cardiology

## 2016-03-06 NOTE — Anesthesia Preprocedure Evaluation (Addendum)
Anesthesia Evaluation  Patient identified by MRN, date of birth, ID band Patient awake    Reviewed: Allergy & Precautions, H&P , NPO status , Patient's Chart, lab work & pertinent test results, reviewed documented beta blocker date and time   Airway Mallampati: II  TM Distance: >3 FB Neck ROM: Full    Dental no notable dental hx. (+) Partial Upper, Dental Advisory Given   Pulmonary COPD,  COPD inhaler,    Pulmonary exam normal breath sounds clear to auscultation       Cardiovascular hypertension, Pt. on medications and Pt. on home beta blockers  Rhythm:Regular Rate:Normal     Neuro/Psych negative neurological ROS  negative psych ROS   GI/Hepatic Neg liver ROS, GERD  Medicated and Controlled,  Endo/Other  Hypothyroidism Morbid obesity  Renal/GU Renal InsufficiencyRenal disease  negative genitourinary   Musculoskeletal  (+) Arthritis , Osteoarthritis,    Abdominal   Peds  Hematology negative hematology ROS (+)   Anesthesia Other Findings   Reproductive/Obstetrics negative OB ROS                           Anesthesia Physical Anesthesia Plan  ASA: III  Anesthesia Plan: MAC, Spinal and Regional   Post-op Pain Management:  Regional for Post-op pain   Induction: Intravenous  Airway Management Planned: Simple Face Mask  Additional Equipment:   Intra-op Plan:   Post-operative Plan:   Informed Consent: I have reviewed the patients History and Physical, chart, labs and discussed the procedure including the risks, benefits and alternatives for the proposed anesthesia with the patient or authorized representative who has indicated his/her understanding and acceptance.   Dental advisory given  Plan Discussed with: CRNA  Anesthesia Plan Comments:         Anesthesia Quick Evaluation

## 2016-03-07 ENCOUNTER — Encounter (HOSPITAL_COMMUNITY): Payer: Self-pay | Admitting: Certified Registered Nurse Anesthetist

## 2016-03-07 ENCOUNTER — Inpatient Hospital Stay (HOSPITAL_COMMUNITY): Payer: Medicare Other | Admitting: Anesthesiology

## 2016-03-07 ENCOUNTER — Inpatient Hospital Stay (HOSPITAL_COMMUNITY)
Admission: RE | Admit: 2016-03-07 | Discharge: 2016-03-09 | DRG: 470 | Disposition: A | Discharge status: Home Health | Payer: Medicare Other | Source: Ambulatory Visit | Attending: Orthopedic Surgery | Admitting: Orthopedic Surgery

## 2016-03-07 ENCOUNTER — Inpatient Hospital Stay (HOSPITAL_COMMUNITY): Payer: Medicare Other | Admitting: Vascular Surgery

## 2016-03-07 ENCOUNTER — Encounter (HOSPITAL_COMMUNITY)
Admission: RE | Disposition: A | Discharge status: Home Health | Payer: Self-pay | Source: Ambulatory Visit | Attending: Orthopedic Surgery

## 2016-03-07 DIAGNOSIS — E079 Disorder of thyroid, unspecified: Secondary | ICD-10-CM | POA: Diagnosis present

## 2016-03-07 DIAGNOSIS — Z96652 Presence of left artificial knee joint: Secondary | ICD-10-CM | POA: Diagnosis present

## 2016-03-07 DIAGNOSIS — J449 Chronic obstructive pulmonary disease, unspecified: Secondary | ICD-10-CM | POA: Diagnosis present

## 2016-03-07 DIAGNOSIS — R7981 Abnormal blood-gas level: Secondary | ICD-10-CM | POA: Diagnosis present

## 2016-03-07 DIAGNOSIS — E039 Hypothyroidism, unspecified: Secondary | ICD-10-CM | POA: Diagnosis present

## 2016-03-07 DIAGNOSIS — Z6841 Body Mass Index (BMI) 40.0 and over, adult: Secondary | ICD-10-CM | POA: Diagnosis not present

## 2016-03-07 DIAGNOSIS — Z7951 Long term (current) use of inhaled steroids: Secondary | ICD-10-CM | POA: Diagnosis not present

## 2016-03-07 DIAGNOSIS — K219 Gastro-esophageal reflux disease without esophagitis: Secondary | ICD-10-CM | POA: Diagnosis present

## 2016-03-07 DIAGNOSIS — I10 Essential (primary) hypertension: Secondary | ICD-10-CM | POA: Diagnosis present

## 2016-03-07 DIAGNOSIS — M21061 Valgus deformity, not elsewhere classified, right knee: Secondary | ICD-10-CM | POA: Diagnosis present

## 2016-03-07 DIAGNOSIS — J441 Chronic obstructive pulmonary disease with (acute) exacerbation: Secondary | ICD-10-CM | POA: Diagnosis present

## 2016-03-07 DIAGNOSIS — M1711 Unilateral primary osteoarthritis, right knee: Principal | ICD-10-CM | POA: Diagnosis present

## 2016-03-07 DIAGNOSIS — Z79899 Other long term (current) drug therapy: Secondary | ICD-10-CM | POA: Diagnosis not present

## 2016-03-07 DIAGNOSIS — M25561 Pain in right knee: Secondary | ICD-10-CM | POA: Diagnosis present

## 2016-03-07 DIAGNOSIS — Z96651 Presence of right artificial knee joint: Secondary | ICD-10-CM

## 2016-03-07 DIAGNOSIS — R262 Difficulty in walking, not elsewhere classified: Secondary | ICD-10-CM

## 2016-03-07 HISTORY — DX: Unilateral primary osteoarthritis, right knee: M17.11

## 2016-03-07 HISTORY — DX: Presence of left artificial knee joint: Z96.652

## 2016-03-07 HISTORY — DX: Abnormal blood-gas level: R79.81

## 2016-03-07 HISTORY — PX: TOTAL KNEE ARTHROPLASTY: SHX125

## 2016-03-07 LAB — CBC
HEMATOCRIT: 37.8 % (ref 36.0–46.0)
HEMOGLOBIN: 11.8 g/dL — AB (ref 12.0–15.0)
MCH: 28.2 pg (ref 26.0–34.0)
MCHC: 31.2 g/dL (ref 30.0–36.0)
MCV: 90.4 fL (ref 78.0–100.0)
Platelets: 204 10*3/uL (ref 150–400)
RBC: 4.18 MIL/uL (ref 3.87–5.11)
RDW: 13.6 % (ref 11.5–15.5)
WBC: 6.4 10*3/uL (ref 4.0–10.5)

## 2016-03-07 LAB — GLUCOSE, CAPILLARY: Glucose-Capillary: 87 mg/dL (ref 65–99)

## 2016-03-07 LAB — CREATININE, SERUM
Creatinine, Ser: 1.18 mg/dL — ABNORMAL HIGH (ref 0.44–1.00)
GFR calc Af Amer: 52 mL/min — ABNORMAL LOW (ref >60)
GFR calc non Af Amer: 45 mL/min — ABNORMAL LOW (ref >60)

## 2016-03-07 SURGERY — ARTHROPLASTY, KNEE, TOTAL
Anesthesia: Monitor Anesthesia Care | Site: Knee | Laterality: Right

## 2016-03-07 MED ORDER — CEFUROXIME SODIUM 1.5 G IJ SOLR
INTRAMUSCULAR | Status: DC | PRN
  Administered 2016-03-07: 1.5 g

## 2016-03-07 MED ORDER — ACETAMINOPHEN 500 MG PO TABS
ORAL_TABLET | ORAL | Status: AC
  Administered 2016-03-07: 1000 mg
  Filled 2016-03-07: qty 2

## 2016-03-07 MED ORDER — ONDANSETRON HCL 4 MG/2ML IJ SOLN: 4.0000 mg | Freq: Four times a day (QID) | INTRAMUSCULAR | Status: DC | PRN

## 2016-03-07 MED ORDER — FLUTICASONE PROPIONATE 50 MCG/ACT NA SUSP: 2.0000 | Freq: Every day | NASAL | Status: DC | PRN

## 2016-03-07 MED ORDER — POLYETHYLENE GLYCOL 3350 17 G PO PACK
17.0000 g | PACK | Freq: Two times a day (BID) | ORAL | Status: DC
  Administered 2016-03-07 – 2016-03-09 (×4): 17 g via ORAL
  Filled 2016-03-07 (×3): qty 1

## 2016-03-07 MED ORDER — POVIDONE-IODINE 7.5 % EX SOLN
Freq: Once | CUTANEOUS | Status: DC
Start: 2016-03-07 — End: 2016-03-07

## 2016-03-07 MED ORDER — BUPIVACAINE IN DEXTROSE 0.75-8.25 % IT SOLN
INTRATHECAL | Status: DC | PRN
  Administered 2016-03-07: 15 mg via INTRATHECAL

## 2016-03-07 MED ORDER — THROMBIN 20000 UNITS EX KIT
PACK | CUTANEOUS | Status: DC | PRN
  Administered 2016-03-07: 20000 [IU] via TOPICAL

## 2016-03-07 MED ORDER — FAMOTIDINE 20 MG PO TABS
20.0000 mg | ORAL_TABLET | Freq: Two times a day (BID) | ORAL | Status: DC
  Administered 2016-03-07 – 2016-03-09 (×4): 20 mg via ORAL
  Filled 2016-03-07 (×4): qty 1

## 2016-03-07 MED ORDER — DIPHENHYDRAMINE HCL 12.5 MG/5ML PO ELIX: 12.5000 mg | ORAL_SOLUTION | ORAL | Status: DC | PRN

## 2016-03-07 MED ORDER — PHENYLEPHRINE HCL 10 MG/ML IJ SOLN
INTRAVENOUS | Status: DC | PRN
  Administered 2016-03-07: 30 ug/min via INTRAVENOUS

## 2016-03-07 MED ORDER — LACTATED RINGERS IV SOLN
INTRAVENOUS | Status: DC
  Administered 2016-03-07: 11:00:00 via INTRAVENOUS

## 2016-03-07 MED ORDER — PHENOL 1.4 % MT LIQD: 1.0000 | OROMUCOSAL | Status: DC | PRN

## 2016-03-07 MED ORDER — METOPROLOL TARTRATE 100 MG PO TABS
100.0000 mg | ORAL_TABLET | Freq: Every evening | ORAL | Status: DC
  Filled 2016-03-07 (×2): qty 1

## 2016-03-07 MED ORDER — MIDAZOLAM HCL 2 MG/2ML IJ SOLN
INTRAMUSCULAR | Status: AC
  Filled 2016-03-07: qty 2

## 2016-03-07 MED ORDER — CELECOXIB 200 MG PO CAPS
200.0000 mg | ORAL_CAPSULE | Freq: Two times a day (BID) | ORAL | Status: DC
  Administered 2016-03-07 – 2016-03-09 (×4): 200 mg via ORAL
  Filled 2016-03-07 (×4): qty 1

## 2016-03-07 MED ORDER — OXYCODONE HCL 5 MG PO TABS
5.0000 mg | ORAL_TABLET | ORAL | Status: DC | PRN
  Administered 2016-03-07 – 2016-03-08 (×5): 10 mg via ORAL
  Filled 2016-03-07 (×5): qty 2

## 2016-03-07 MED ORDER — EPINEPHRINE PF 1 MG/ML IJ SOLN
INTRAMUSCULAR | Status: AC
  Filled 2016-03-07: qty 1

## 2016-03-07 MED ORDER — VITAMIN D 1000 UNITS PO TABS
2000.0000 [IU] | ORAL_TABLET | Freq: Every day | ORAL | Status: DC
  Administered 2016-03-08 – 2016-03-09 (×2): 2000 [IU] via ORAL
  Filled 2016-03-07 (×3): qty 2

## 2016-03-07 MED ORDER — PHENYLEPHRINE HCL 10 MG/ML IJ SOLN
INTRAMUSCULAR | Status: DC | PRN
  Administered 2016-03-07 (×5): 80 ug via INTRAVENOUS

## 2016-03-07 MED ORDER — BUPIVACAINE HCL (PF) 0.25 % IJ SOLN
INTRAMUSCULAR | Status: AC
  Filled 2016-03-07: qty 30

## 2016-03-07 MED ORDER — LIDOCAINE 2% (20 MG/ML) 5 ML SYRINGE
INTRAMUSCULAR | Status: AC
  Filled 2016-03-07: qty 5

## 2016-03-07 MED ORDER — POTASSIUM CHLORIDE IN NACL 20-0.9 MEQ/L-% IV SOLN
INTRAVENOUS | Status: DC
  Administered 2016-03-07 – 2016-03-08 (×2): via INTRAVENOUS
  Filled 2016-03-07 (×2): qty 1000

## 2016-03-07 MED ORDER — CEFUROXIME SODIUM 1.5 G IJ SOLR
INTRAMUSCULAR | Status: AC
  Filled 2016-03-07: qty 1.5

## 2016-03-07 MED ORDER — BUPIVACAINE-EPINEPHRINE 0.25% -1:200000 IJ SOLN
INTRAMUSCULAR | Status: DC | PRN
  Administered 2016-03-07: 30 mL

## 2016-03-07 MED ORDER — LORATADINE 10 MG PO TABS
10.0000 mg | ORAL_TABLET | Freq: Every day | ORAL | Status: DC
  Administered 2016-03-07 – 2016-03-09 (×3): 10 mg via ORAL
  Filled 2016-03-07 (×3): qty 1

## 2016-03-07 MED ORDER — LACTATED RINGERS IV SOLN
INTRAVENOUS | Status: DC | PRN
  Administered 2016-03-07 (×2): via INTRAVENOUS

## 2016-03-07 MED ORDER — ONDANSETRON HCL 4 MG/2ML IJ SOLN
INTRAMUSCULAR | Status: AC
  Filled 2016-03-07: qty 2

## 2016-03-07 MED ORDER — GABAPENTIN 300 MG PO CAPS
ORAL_CAPSULE | ORAL | Status: AC
  Filled 2016-03-07: qty 1

## 2016-03-07 MED ORDER — ACETAMINOPHEN 650 MG RE SUPP: 650.0000 mg | Freq: Four times a day (QID) | RECTAL | Status: DC | PRN

## 2016-03-07 MED ORDER — MENTHOL 3 MG MT LOZG: 1.0000 | LOZENGE | OROMUCOSAL | Status: DC | PRN

## 2016-03-07 MED ORDER — PROPOFOL 500 MG/50ML IV EMUL
INTRAVENOUS | Status: DC | PRN
  Administered 2016-03-07: 100 ug/kg/min via INTRAVENOUS
  Administered 2016-03-07: 13:00:00 via INTRAVENOUS

## 2016-03-07 MED ORDER — CHLORHEXIDINE GLUCONATE 4 % EX LIQD: 60.0000 mL | Freq: Once | CUTANEOUS | Status: DC

## 2016-03-07 MED ORDER — SODIUM CHLORIDE 0.9 % IR SOLN
Status: DC | PRN
  Administered 2016-03-07: 3000 mL

## 2016-03-07 MED ORDER — HYDROMORPHONE HCL 2 MG/ML IJ SOLN
1.0000 mg | INTRAMUSCULAR | Status: DC | PRN
  Administered 2016-03-07 – 2016-03-08 (×3): 1 mg via INTRAVENOUS
  Filled 2016-03-07 (×3): qty 1

## 2016-03-07 MED ORDER — HYDROMORPHONE HCL 1 MG/ML IJ SOLN: 0.2500 mg | INTRAMUSCULAR | Status: DC | PRN

## 2016-03-07 MED ORDER — DEXAMETHASONE SODIUM PHOSPHATE 10 MG/ML IJ SOLN
10.0000 mg | Freq: Three times a day (TID) | INTRAMUSCULAR | Status: AC
  Administered 2016-03-07 – 2016-03-08 (×4): 10 mg via INTRAVENOUS
  Filled 2016-03-07 (×4): qty 1

## 2016-03-07 MED ORDER — ACETAMINOPHEN 325 MG PO TABS
ORAL_TABLET | ORAL | Status: DC | PRN
  Administered 2016-03-07: 1000 mg via ORAL

## 2016-03-07 MED ORDER — 0.9 % SODIUM CHLORIDE (POUR BTL) OPTIME
TOPICAL | Status: DC | PRN
  Administered 2016-03-07: 1000 mL

## 2016-03-07 MED ORDER — PRAVASTATIN SODIUM 40 MG PO TABS
80.0000 mg | ORAL_TABLET | Freq: Every day | ORAL | Status: DC
  Administered 2016-03-07 – 2016-03-09 (×3): 80 mg via ORAL
  Filled 2016-03-07 (×3): qty 2

## 2016-03-07 MED ORDER — CEFAZOLIN SODIUM-DEXTROSE 2-4 GM/100ML-% IV SOLN
2.0000 g | Freq: Four times a day (QID) | INTRAVENOUS | Status: AC
  Administered 2016-03-07 (×2): 2 g via INTRAVENOUS
  Filled 2016-03-07 (×2): qty 100

## 2016-03-07 MED ORDER — FENTANYL CITRATE (PF) 100 MCG/2ML IJ SOLN
INTRAMUSCULAR | Status: AC
  Filled 2016-03-07: qty 4

## 2016-03-07 MED ORDER — FENTANYL CITRATE (PF) 100 MCG/2ML IJ SOLN
INTRAMUSCULAR | Status: AC
  Filled 2016-03-07: qty 2

## 2016-03-07 MED ORDER — METOCLOPRAMIDE HCL 5 MG PO TABS: 5.0000 mg | ORAL_TABLET | Freq: Three times a day (TID) | ORAL | Status: DC | PRN

## 2016-03-07 MED ORDER — ROPIVACAINE HCL 7.5 MG/ML IJ SOLN
INTRAMUSCULAR | Status: DC | PRN
  Administered 2016-03-07: 20 mL via PERINEURAL

## 2016-03-07 MED ORDER — ONDANSETRON HCL 4 MG PO TABS: 4.0000 mg | ORAL_TABLET | Freq: Four times a day (QID) | ORAL | Status: DC | PRN

## 2016-03-07 MED ORDER — PHENYLEPHRINE 40 MCG/ML (10ML) SYRINGE FOR IV PUSH (FOR BLOOD PRESSURE SUPPORT)
PREFILLED_SYRINGE | INTRAVENOUS | Status: AC
  Filled 2016-03-07: qty 10

## 2016-03-07 MED ORDER — METOCLOPRAMIDE HCL 5 MG/ML IJ SOLN: 5.0000 mg | Freq: Three times a day (TID) | INTRAMUSCULAR | Status: DC | PRN

## 2016-03-07 MED ORDER — ENOXAPARIN SODIUM 30 MG/0.3ML ~~LOC~~ SOLN
30.0000 mg | Freq: Two times a day (BID) | SUBCUTANEOUS | Status: DC
  Administered 2016-03-08 – 2016-03-09 (×3): 30 mg via SUBCUTANEOUS
  Filled 2016-03-07 (×3): qty 0.3

## 2016-03-07 MED ORDER — THROMBIN 20000 UNITS EX KIT
PACK | CUTANEOUS | Status: AC
  Filled 2016-03-07: qty 1

## 2016-03-07 MED ORDER — LEVOTHYROXINE SODIUM 100 MCG PO TABS
100.0000 ug | ORAL_TABLET | Freq: Every day | ORAL | Status: DC
  Administered 2016-03-08 – 2016-03-09 (×2): 100 ug via ORAL
  Filled 2016-03-07 (×2): qty 1

## 2016-03-07 MED ORDER — DOCUSATE SODIUM 100 MG PO CAPS
100.0000 mg | ORAL_CAPSULE | Freq: Two times a day (BID) | ORAL | Status: DC
  Administered 2016-03-07 – 2016-03-09 (×4): 100 mg via ORAL
  Filled 2016-03-07 (×4): qty 1

## 2016-03-07 MED ORDER — FUROSEMIDE 40 MG PO TABS
40.0000 mg | ORAL_TABLET | Freq: Every day | ORAL | Status: DC
  Administered 2016-03-09: 40 mg via ORAL
  Filled 2016-03-07: qty 1

## 2016-03-07 MED ORDER — TIOTROPIUM BROMIDE MONOHYDRATE 18 MCG IN CAPS
18.0000 ug | ORAL_CAPSULE | Freq: Every day | RESPIRATORY_TRACT | Status: DC
  Administered 2016-03-07 – 2016-03-09 (×3): 18 ug via RESPIRATORY_TRACT
  Filled 2016-03-07: qty 5

## 2016-03-07 MED ORDER — ONDANSETRON HCL 4 MG/2ML IJ SOLN
INTRAMUSCULAR | Status: DC | PRN
  Administered 2016-03-07: 4 mg via INTRAVENOUS

## 2016-03-07 MED ORDER — FENTANYL CITRATE (PF) 100 MCG/2ML IJ SOLN
INTRAMUSCULAR | Status: DC | PRN
  Administered 2016-03-07: 50 ug via INTRAVENOUS

## 2016-03-07 MED ORDER — ALUM & MAG HYDROXIDE-SIMETH 200-200-20 MG/5ML PO SUSP: 30.0000 mL | ORAL | Status: DC | PRN

## 2016-03-07 MED ORDER — ACETAMINOPHEN 325 MG PO TABS
650.0000 mg | ORAL_TABLET | Freq: Four times a day (QID) | ORAL | Status: DC | PRN
  Administered 2016-03-08 – 2016-03-09 (×4): 650 mg via ORAL
  Filled 2016-03-07 (×4): qty 2

## 2016-03-07 SURGICAL SUPPLY — 72 items
APL SKNCLS STERI-STRIP NONHPOA (GAUZE/BANDAGES/DRESSINGS) ×1
BANDAGE ESMARK 6X9 LF (GAUZE/BANDAGES/DRESSINGS) ×1 IMPLANT
BENZOIN TINCTURE PRP APPL 2/3 (GAUZE/BANDAGES/DRESSINGS) ×2 IMPLANT
BLADE SAGITTAL 25.0X1.19X90 (BLADE) ×2 IMPLANT
BLADE SAW SGTL 13X75X1.27 (BLADE) ×2 IMPLANT
BLADE SURG 10 STRL SS (BLADE) ×4 IMPLANT
BNDG CMPR 9X6 STRL LF SNTH (GAUZE/BANDAGES/DRESSINGS) ×1
BNDG CMPR MED 15X6 ELC VLCR LF (GAUZE/BANDAGES/DRESSINGS) ×1
BNDG ELASTIC 6X15 VLCR STRL LF (GAUZE/BANDAGES/DRESSINGS) ×2 IMPLANT
BNDG ESMARK 6X9 LF (GAUZE/BANDAGES/DRESSINGS) ×2
BOWL SMART MIX CTS (DISPOSABLE) ×2 IMPLANT
CAPT KNEE TOTAL 3 ATTUNE ×1 IMPLANT
CEMENT HV SMART SET (Cement) ×4 IMPLANT
CLSR STERI-STRIP ANTIMIC 1/2X4 (GAUZE/BANDAGES/DRESSINGS) ×1 IMPLANT
COVER SURGICAL LIGHT HANDLE (MISCELLANEOUS) ×2 IMPLANT
CUFF TOURNIQUET SINGLE 34IN LL (TOURNIQUET CUFF) ×2 IMPLANT
CUFF TOURNIQUET SINGLE 44IN (TOURNIQUET CUFF) IMPLANT
DECANTER SPIKE VIAL GLASS SM (MISCELLANEOUS) ×2 IMPLANT
DRAPE EXTREMITY T 121X128X90 (DRAPE) ×2 IMPLANT
DRAPE HALF SHEET 40X57 (DRAPES) ×2 IMPLANT
DRAPE INCISE IOBAN 66X45 STRL (DRAPES) ×2 IMPLANT
DRAPE PROXIMA HALF (DRAPES) ×2 IMPLANT
DRAPE U-SHAPE 47X51 STRL (DRAPES) ×2 IMPLANT
DRSG AQUACEL AG ADV 3.5X14 (GAUZE/BANDAGES/DRESSINGS) ×2 IMPLANT
DURAPREP 26ML APPLICATOR (WOUND CARE) ×4 IMPLANT
ELECT CAUTERY BLADE 6.4 (BLADE) ×2 IMPLANT
ELECT REM PT RETURN 9FT ADLT (ELECTROSURGICAL) ×2
ELECTRODE REM PT RTRN 9FT ADLT (ELECTROSURGICAL) ×1 IMPLANT
FACESHIELD WRAPAROUND (MASK) ×2 IMPLANT
FACESHIELD WRAPAROUND OR TEAM (MASK) ×1 IMPLANT
GLOVE BIO SURGEON STRL SZ7 (GLOVE) ×2 IMPLANT
GLOVE BIOGEL PI IND STRL 7.0 (GLOVE) ×1 IMPLANT
GLOVE BIOGEL PI IND STRL 7.5 (GLOVE) ×1 IMPLANT
GLOVE BIOGEL PI INDICATOR 7.0 (GLOVE) ×1
GLOVE BIOGEL PI INDICATOR 7.5 (GLOVE) ×1
GLOVE SS BIOGEL STRL SZ 7.5 (GLOVE) ×1 IMPLANT
GLOVE SUPERSENSE BIOGEL SZ 7.5 (GLOVE) ×1
GOWN STRL REUS W/ TWL LRG LVL3 (GOWN DISPOSABLE) ×1 IMPLANT
GOWN STRL REUS W/ TWL XL LVL3 (GOWN DISPOSABLE) ×2 IMPLANT
GOWN STRL REUS W/TWL LRG LVL3 (GOWN DISPOSABLE) ×2
GOWN STRL REUS W/TWL XL LVL3 (GOWN DISPOSABLE) ×4
HANDPIECE INTERPULSE COAX TIP (DISPOSABLE) ×2
HOOD PEEL AWAY FACE SHEILD DIS (HOOD) ×4 IMPLANT
IMMOBILIZER KNEE 22 (SOFTGOODS) ×1 IMPLANT
IMMOBILIZER KNEE 22 UNIV (SOFTGOODS) ×2 IMPLANT
KIT BASIN OR (CUSTOM PROCEDURE TRAY) ×2 IMPLANT
KIT ROOM TURNOVER OR (KITS) ×2 IMPLANT
MANIFOLD NEPTUNE II (INSTRUMENTS) ×2 IMPLANT
MARKER SKIN DUAL TIP RULER LAB (MISCELLANEOUS) ×2 IMPLANT
NDL 18GX1X1/2 (RX/OR ONLY) (NEEDLE) ×1 IMPLANT
NEEDLE 18GX1X1/2 (RX/OR ONLY) (NEEDLE) ×2 IMPLANT
NS IRRIG 1000ML POUR BTL (IV SOLUTION) ×2 IMPLANT
PACK TOTAL JOINT (CUSTOM PROCEDURE TRAY) ×2 IMPLANT
PAD ARMBOARD 7.5X6 YLW CONV (MISCELLANEOUS) ×4 IMPLANT
SET HNDPC FAN SPRY TIP SCT (DISPOSABLE) ×1 IMPLANT
STRIP CLOSURE SKIN 1/2X4 (GAUZE/BANDAGES/DRESSINGS) ×2 IMPLANT
SUCTION FRAZIER HANDLE 10FR (MISCELLANEOUS) ×1
SUCTION TUBE FRAZIER 10FR DISP (MISCELLANEOUS) ×1 IMPLANT
SUT MNCRL AB 3-0 PS2 18 (SUTURE) ×2 IMPLANT
SUT VIC AB 0 CT1 27 (SUTURE) ×4
SUT VIC AB 0 CT1 27XBRD ANBCTR (SUTURE) ×2 IMPLANT
SUT VIC AB 1 CT1 27 (SUTURE) ×2
SUT VIC AB 1 CT1 27XBRD ANBCTR (SUTURE) ×1 IMPLANT
SUT VIC AB 2-0 CT1 27 (SUTURE) ×6
SUT VIC AB 2-0 CT1 TAPERPNT 27 (SUTURE) ×2 IMPLANT
SYR 30ML LL (SYRINGE) ×2 IMPLANT
TOWEL OR 17X24 6PK STRL BLUE (TOWEL DISPOSABLE) ×2 IMPLANT
TOWEL OR 17X26 10 PK STRL BLUE (TOWEL DISPOSABLE) ×2 IMPLANT
TRAY CATH 16FR W/PLASTIC CATH (SET/KITS/TRAYS/PACK) IMPLANT
TRAY FOLEY CATH 16FR SILVER (SET/KITS/TRAYS/PACK) ×2 IMPLANT
TUBE CONNECTING 12X1/4 (SUCTIONS) ×2 IMPLANT
YANKAUER SUCT BULB TIP NO VENT (SUCTIONS) ×2 IMPLANT

## 2016-03-07 NOTE — Anesthesia Procedure Notes (Signed)
Spinal  Patient location during procedure: OR Start time: 03/07/2016 11:19 AM End time: 03/07/2016 11:23 AM Staffing Anesthesiologist: Gaynelle AduFITZGERALD, Zierra Laroque Performed: anesthesiologist  Preanesthetic Checklist Completed: patient identified, surgical consent, pre-op evaluation, timeout performed, IV checked, risks and benefits discussed and monitors and equipment checked Spinal Block Patient position: sitting Prep: DuraPrep Patient monitoring: cardiac monitor, continuous pulse ox and blood pressure Approach: midline Location: L3-4 Injection technique: single-shot Needle Needle type: Pencan  Needle gauge: 24 G Needle length: 9 cm Assessment Sensory level: T8 Additional Notes Functioning IV was confirmed and monitors were applied. Sterile prep and drape, including hand hygiene and sterile gloves were used. The patient was positioned and the spine was prepped. The skin was anesthetized with lidocaine.  Free flow of clear CSF was obtained prior to injecting local anesthetic into the CSF.  The spinal needle aspirated freely following injection.  The needle was carefully withdrawn.  The patient tolerated the procedure well.

## 2016-03-07 NOTE — Op Note (Signed)
MRN:     [id number] DOB/AGE:    08-15-1940 / 75 y.o.       OPERATIVE REPORT    DATE OF PROCEDURE:  03/07/2016       PREOPERATIVE DIAGNOSIS:   Primary localized OA right knee     Estimated body mass index is 43.89 kg/m as calculated from the following:   Height as of this encounter: 5' 1.25" (1.556 m).   Weight as of this encounter: 106.2 kg (234 lb 3.2 oz).                                                        POSTOPERATIVE DIAGNOSIS:  same                                                                  PROCEDURE:  Procedure(s): TOTAL KNEE ARTHROPLASTY Using Depuy Attune RP implants #4 Femur, #4Tibia, 10mm  RP bearing, 32 Patella     SURGEON: Daivion Pape A    ASSISTANT:  Kirstin Shepperson PA-C   (Present and scrubbed throughout the case, critical for assistance with exposure, retraction, instrumentation, and closure.)         ANESTHESIA: Spinal with Adductor Nerve Block     TOURNIQUET TIME: 80min   COMPLICATIONS:  None     SPECIMENS: None   INDICATIONS FOR PROCEDURE: The patient has  * No pre-op diagnosis entered *, varus deformities, XR shows bone on bone arthritis. Patient has failed all conservative measures including anti-inflammatory medicines, narcotics, attempts at  exercise and weight loss, cortisone injections and viscosupplementation.  Risks and benefits of surgery have been discussed, questions answered.   DESCRIPTION OF PROCEDURE: The patient identified by armband, received  right femoral nerve block and IV antibiotics, [facility]. Patient taken to the operating room, appropriate anesthetic  monitors were attached spinal anesthesia induced with  the patient in supine position, Foley catheter was inserted. Tourniquet  applied high to the operative thigh. Lateral post and foot positioner  applied to the table, the lower extremity was then prepped and draped  in usual sterile fashion from the ankle to the tourniquet. Time-out  procedure was performed. The limb was wrapped with an Esmarch bandage and the tourniquet inflated to 365 mmHg. We began the operation by making the anterior midline incision starting at handbreadth above the patella going over the patella 1 cm medial to and  4 cm distal to the tibial tubercle. Small bleeders in the skin and the  subcutaneous tissue identified and cauterized. Transverse retinaculum was incised and reflected medially and a medial parapatellar arthrotomy was accomplished. the patella was everted and theprepatellar fat pad resected. The superficial medial collateral  ligament was then elevated from anterior to posterior along the proximal  flare of the tibia and anterior half of the menisci resected. The knee was hyperflexed exposing bone on bone arthritis. Peripheral and notch osteophytes as well as the cruciate ligaments were then resected. We continued to  work our way around posteriorly along the proximal tibia, and externally  rotated the tibia subluxing it out from  underneath the femur. A McHale  retractor was placed through the notch and a lateral Hohmann retractor  placed, and we then drilled through the proximal tibia in line with the  axis of the tibia followed by an intramedullary guide rod and 2-degree  posterior slope cutting guide. The tibial cutting guide was pinned into place  allowing resection of 6 mm of bone medially and about 4 mm of bone  laterally because of her valgus deformity. Satisfied with the tibial resection, we then  entered the distal femur 2 mm anterior to the PCL origin with the  intramedullary guide rod and applied the distal femoral cutting guide  set at 11mm, with 5 degrees of valgus. This was pinned along the  epicondylar axis. At this point, the distal femoral cut was accomplished without difficulty. We then sized for a #4 femoral component and pinned the guide in 3 degrees of external rotation.The chamfer cutting guide was pinned into place. The  anterior, posterior, and chamfer cuts were accomplished without difficulty followed by  the  RP box cutting guide and the box cut. We also removed posterior osteophytes from the posterior femoral condyles. At this  time, the knee was brought into full extension. We checked our  extension and flexion gaps and found them symmetric at 10mm.  The patella thickness measured at 25 mm. We set the cutting guide at 15 and removed the posterior 9.5-10 mm  of the patella sized for 32 button and drilled the lollipop. The knee  was then once again hyperflexed exposing the proximal tibia. We sized for a #4 tibial base plate, applied the smokestack and the conical reamer followed by the the Delta fin keel punch. We then hammered into place the  RP trial femoral component, inserted a 1 trial bearing, trial patellar button, and took the knee through range of motion from 0-130 degrees. No thumb pressure was required for patellar  tracking. At this point, all trial components were removed, a double batch of DePuy HV cement with 1500 mg of Zinacef was mixed and applied to all bony metallic mating surfaces except for the posterior condyles of the femur itself. In order, we  hammered into place the tibial tray and removed excess cement, the femoral component and removed excess cement, a 10mm  RP bearing  was inserted, and the knee brought to full extension with compression.  The patellar button was clamped into place, and excess cement  removed. While the cement cured the wound was irrigated out with normal saline solution pulse lavage.. Ligament stability and patellar tracking were checked and found to be excellent.. The parapatellar arthrotomy was closed with  #1 Vicryl suture. The subcutaneous tissue with 0 and 2-0 undyed  Vicryl suture, and 4-0 Monocryl.. A dressing of Aquaseal,  4 x 4, dressing sponges, Webril, and Ace wrap applied. Needle and sponge count were correct times 2.The patient awakened, extubated, and taken  to recovery room without difficulty. Vascular status was normal, pulses 2+ and symmetric.   Nymir Ringler A 03/07/2016, 12:58 PM

## 2016-03-07 NOTE — Progress Notes (Signed)
Orthopedic Tech Progress Note Patient Details:  Valerie Beck [birth year] [id number]  CPM Right Knee CPM Right Knee: On Right Knee Flexion (Degrees): 90 Right Knee Extension (Degrees): 0 Additional Comments: trapeze bar patient helper   Valerie Beck, Valerie Beck 03/07/2016, 2:39 PM Viewed order from doctor's order list

## 2016-03-07 NOTE — Anesthesia Procedure Notes (Signed)
Procedure Name: MAC Date/Time: 03/07/2016 11:25 AM Performed by: Rise PatienceBELL, Murielle Stang T Pre-anesthesia Checklist: Patient identified, Emergency Drugs available, Suction available and Patient being monitored Patient Re-evaluated:Patient Re-evaluated prior to inductionOxygen Delivery Method: Simple face mask Preoxygenation: Pre-oxygenation with 100% oxygen Intubation Type: IV induction Placement Confirmation: positive ETCO2 and breath sounds checked- equal and bilateral Dental Injury: Teeth and Oropharynx as per pre-operative assessment

## 2016-03-07 NOTE — Anesthesia Postprocedure Evaluation (Signed)
Anesthesia Post Note  Patient: Valerie Beck  Procedure(s) Performed: Procedure(s) (LRB): TOTAL KNEE ARTHROPLASTY (Right)  Patient location during evaluation: PACU Anesthesia Type: Spinal, Regional and MAC Level of consciousness: oriented and awake and alert Pain management: pain level controlled Vital Signs Assessment: post-procedure vital signs reviewed and stable Respiratory status: spontaneous breathing, respiratory function stable and patient connected to nasal cannula oxygen Cardiovascular status: blood pressure returned to baseline and stable Postop Assessment: no headache and no backache Anesthetic complications: no    Last Vitals:  Vitals:   03/07/16 1500 03/07/16 1833  BP: (!) 120/51 (!) 109/48  Pulse: 62 70  Resp: 16   Temp: 36.2 C     Last Pain:  Vitals:   03/07/16 1907  TempSrc:   PainSc: 8                  Kerrigan Glendening,W. EDMOND

## 2016-03-07 NOTE — Anesthesia Procedure Notes (Addendum)
Anesthesia Regional Block:  Adductor canal block  Pre-Anesthetic Checklist: ,, timeout performed, Correct Patient, Correct Site, Correct Laterality, Correct Procedure, Correct Position, site marked, Risks and benefits discussed, pre-op evaluation,  At surgeon's request and post-op pain management  Laterality: Right  Prep: Maximum Sterile Barrier Precautions used, chloraprep       Needles:  Injection technique: Single-shot  Needle Type: Echogenic Stimulator Needle     Needle Length: 9cm 9 cm Needle Gauge: 21 and 21 G    Additional Needles:  Procedures: ultrasound guided (picture in chart) Adductor canal block Narrative:  Start time: 03/07/2016 11:00 AM End time: 03/07/2016 11:10 AM Injection made incrementally with aspirations every 5 mL. Anesthesiologist: Gaynelle AduFITZGERALD, Susumu Hackler  Additional Notes: 2% Lidocaine skin wheel.

## 2016-03-07 NOTE — Transfer of Care (Signed)
Immediate Anesthesia Transfer of Care Note  Patient: Valerie Beck  Procedure(s) Performed: Procedure(s): TOTAL KNEE ARTHROPLASTY (Right)  Patient Location: PACU  Anesthesia Type:MAC, Regional and Spinal  Level of Consciousness: awake, alert  and oriented  Airway & Oxygen Therapy: Patient Spontanous Breathing and Patient connected to face mask oxygen  Post-op Assessment: Report given to RN and Post -op Vital signs reviewed and stable  Post vital signs: Reviewed and stable  Last Vitals:  Vitals:   03/07/16 0915 03/07/16 1110  BP: (!) 167/58 (!) 159/49  Pulse: 71   Resp: 20   Temp: 36.5 C     Last Pain:  Vitals:   03/07/16 0915  TempSrc: Oral         Complications: No apparent anesthesia complications

## 2016-03-07 NOTE — Interval H&P Note (Signed)
History and Physical Interval Note:  03/07/2016 11:05 AM  Valerie SheffieldLynda C Camps  has presented today for surgery, with the diagnosis of djd right knee  The various methods of treatment have been discussed with the patient and family. After consideration of risks, benefits and other options for treatment, the patient has consented to  Procedure(s): TOTAL KNEE ARTHROPLASTY (Right) as a surgical intervention .  The patient's history has been reviewed, patient examined, no change in status, stable for surgery.  I have reviewed the patient's chart and labs.  Questions were answered to the patient's satisfaction.     [name],[name]

## 2016-03-08 ENCOUNTER — Encounter (HOSPITAL_COMMUNITY): Payer: Self-pay | Admitting: Physician Assistant

## 2016-03-08 DIAGNOSIS — R7981 Abnormal blood-gas level: Secondary | ICD-10-CM

## 2016-03-08 HISTORY — DX: Abnormal blood-gas level: R79.81

## 2016-03-08 LAB — BASIC METABOLIC PANEL
ANION GAP: 6 (ref 5–15)
BUN: 19 mg/dL (ref 6–20)
CHLORIDE: 107 mmol/L (ref 101–111)
CO2: 25 mmol/L (ref 22–32)
Calcium: 8.9 mg/dL (ref 8.9–10.3)
Creatinine, Ser: 1.17 mg/dL — ABNORMAL HIGH (ref 0.44–1.00)
GFR calc Af Amer: 52 mL/min — ABNORMAL LOW (ref >60)
GFR calc non Af Amer: 45 mL/min — ABNORMAL LOW (ref >60)
Glucose, Bld: 178 mg/dL — ABNORMAL HIGH (ref 65–99)
POTASSIUM: 4.9 mmol/L (ref 3.5–5.1)
SODIUM: 138 mmol/L (ref 135–145)

## 2016-03-08 LAB — CBC
HCT: 34 % — ABNORMAL LOW (ref 36.0–46.0)
HEMOGLOBIN: 10.7 g/dL — AB (ref 12.0–15.0)
MCH: 28.6 pg (ref 26.0–34.0)
MCHC: 31.5 g/dL (ref 30.0–36.0)
MCV: 90.9 fL (ref 78.0–100.0)
Platelets: 203 10*3/uL (ref 150–400)
RBC: 3.74 MIL/uL — AB (ref 3.87–5.11)
RDW: 13.8 % (ref 11.5–15.5)
WBC: 9.6 10*3/uL (ref 4.0–10.5)

## 2016-03-08 MED ORDER — MOMETASONE FURO-FORMOTEROL FUM 100-5 MCG/ACT IN AERO
2.0000 | INHALATION_SPRAY | Freq: Two times a day (BID) | RESPIRATORY_TRACT | Status: DC
  Administered 2016-03-08 – 2016-03-09 (×2): 2 via RESPIRATORY_TRACT
  Filled 2016-03-08: qty 8.8

## 2016-03-08 NOTE — Progress Notes (Signed)
PT BID Note  Patient progressing with mobility this session as well as able to walk on RA with SpO2 93%.  Will return in AM to practice stairs prior to d/c home with family support and follow up HHPT.     03/08/16 1700  PT Visit Information  Last PT Received On 03/08/16  Assistance Needed +1  History of Present Illness Patient is a [age] y/o female s/p R TKA. PMH positive for COPD, GERD, HTN and L TKA.  Precautions  Precautions Knee;Fall  Restrictions  RLE Weight Bearing WBAT  Pain Assessment  Pain Assessment 0-10  Pain Score 6  Pain Location R Knee  Pain Descriptors / Indicators Aching  Pain Intervention(s) Limited activity within patient's tolerance;Repositioned;Ice applied  Cognition  Arousal/Alertness Awake/alert  Behavior During Therapy WFL for tasks assessed/performed  Overall Cognitive Status Within Functional Limits for tasks assessed  Bed Mobility  Overal bed mobility Needs Assistance  Sit to supine Min guard  General bed mobility comments Assist for safety with getting legs into bed  Transfers  Overall transfer level Needs assistance  Equipment used Rolling walker (2 wheeled)  Transfers Sit to/from Stand  Sit to Stand Min guard  General transfer comment from recliner and 3:1 with cues for technique  Ambulation/Gait  Ambulation/Gait assistance Min guard;Supervision  Ambulation Distance (Feet) 140 Feet  Assistive device Rolling walker (2 wheeled)  Gait Pattern/deviations Antalgic;Step-through pattern;Step-to pattern  General Gait Details assist for balance and safety  Balance  Overall balance assessment Needs assistance  Sitting-balance support No upper extremity supported  Sitting balance-Leahy Scale Good  Standing balance support No upper extremity supported;Single extremity supported  Standing balance-Leahy Scale Fair  Standing balance comment standing to don briefs after toileting no UE support  PT - End of Session  Equipment Utilized During Treatment Gait  belt  Activity Tolerance Patient tolerated treatment well  Patient left in bed;in CPM;with call bell/phone within reach;with family/visitor present  PT - Assessment/Plan  PT Plan Current plan remains appropriate  PT Frequency (ACUTE ONLY) 7X/week  Follow Up Recommendations Home health PT;Supervision/Assistance - 24 hour  PT equipment None recommended by PT  PT Goal Progression  Progress towards PT goals Progressing toward goals  PT Time Calculation  PT Start Time (ACUTE ONLY) 1528  PT Stop Time (ACUTE ONLY) 1554  PT Time Calculation (min) (ACUTE ONLY) 26 min  PT General Charges  $$ ACUTE PT VISIT 1 Procedure  PT Treatments  $Gait Training 8-22 mins  $Therapeutic Activity 8-22 mins  Olinyndi Wynn, [place] [phone] 03/08/2016

## 2016-03-08 NOTE — Evaluation (Signed)
Occupational Therapy Evaluation Patient Details Name: Valerie Beck MRN: [id number] DOB: Sep 19, 1940 Today's Date: 03/08/2016    History of Present Illness Patient is a 75 y/o female s/p R TKA. PMH positive for COPD, GERD, HTN and L TKA.   Clinical Impression   Pt with decline in function and safety with ADLs and ADL mobility with decreased balance and endurance. Pt familiar with DME and A/E from previous knee surgery. Pt would benefit from acute OT services to address impairments to increase level of function and safety    Follow Up Recommendations  No OT follow up;Supervision - Intermittent    Equipment Recommendations  None recommended by OT    Recommendations for Other Services       Precautions / Restrictions Precautions Precautions: Fall;Knee Precaution Comments: reviewd no pillow under R knee with pt and her daughter Restrictions Weight Bearing Restrictions: Yes RLE Weight Bearing: Weight bearing as tolerated      Mobility Bed Mobility Overal bed mobility: Needs Assistance Bed Mobility: Supine to Sit     Supine to sit: HOB elevated;Supervision Sit to supine: Min guard   General bed mobility comments: used rails  Transfers Overall transfer level: Needs assistance Equipment used: Rolling walker (2 wheeled) Transfers: Sit to/from Stand Sit to Stand: Min assist         General transfer comment: verbal cues for correct hand placement and to continue with RW at grab bars until stand - sit transition to toilet    Balance Overall balance assessment: Needs assistance   Sitting balance-Leahy Scale: Good       Standing balance-Leahy Scale: Poor                              ADL Overall ADL's : Needs assistance/impaired     Grooming: Wash/dry hands;Wash/dry face;Min guard;Standing   Upper Body Bathing: Set up;Sitting   Lower Body Bathing: Moderate assistance   Upper Body Dressing : Set up;Sitting   Lower Body Dressing: Moderate  assistance   Toilet Transfer: Minimal assistance;Comfort height toilet;RW;Ambulation Toilet Transfer Details (indicate cue type and reason): verbal cues for correct hand placement and to continue with RW at grab bars until stand - sit transition to toilet Toileting- Clothing Manipulation and Hygiene: Minimal assistance   Tub/ Shower Transfer: 3 in 1     General ADL Comments: Educated pt and her daughter about tub bench for home use. Initiated energy conservation education initiated. Pt holding her breathe and required cues to continue breathing during bed mobility and ADL mobility to keep O2 SATs >90%     Vision Vision Assessment?: No apparent visual deficits              Pertinent Vitals/Pain Pain Assessment: 0-10 Pain Score: 4  Pain Location: R knee Pain Descriptors / Indicators: Aching;Sore Pain Intervention(s): Monitored during session;Premedicated before session;Repositioned;Ice applied     Hand Dominance Right   Extremity/Trunk Assessment Upper Extremity Assessment Upper Extremity Assessment: Defer to OT evaluation   Lower Extremity Assessment Lower Extremity Assessment: RLE deficits/detail RLE Deficits / Details: Ankle AROM WFL, knee AAROM about 0-50, strength at least 3/5 knee extension       Communication Communication Communication: No difficulties   Cognition Arousal/Alertness: Awake/alert Behavior During Therapy: WFL for tasks assessed/performed Overall Cognitive Status: Within Functional Limits for tasks assessed                     General Comments  pt pleasant and cooperative                 Home Living Family/patient expects to be discharged to:: Private residence Living Arrangements: Spouse/significant other;Other relatives Available Help at Discharge: Family;Available 24 hours/day Type of Home: House Home Access: Stairs to enter Enter[employer] CorporationEntrance Stairs-Number of Steps: 2 Entrance Stairs-Rails: Right;Left;Can reach both Home Layout: One  level     Bathroom Shower/Tub: Chief Strategy OfficerTub/shower unit   Bathroom Toilet: Handicapped height     Home Equipment: Cane - single point;Walker - 4 wheels;Bedside commode;Toilet riser          Prior Functioning/Environment Level of Independence: Independent                 OT Problem List: Impaired balance (sitting and/or standing);Pain;Decreased activity tolerance;Decreased knowledge of use of DME or AE   OT Treatment/Interventions: Self-care/ADL training;DME and/or AE instruction;Therapeutic activities;Patient/family education    OT Goals(Current goals can be found in the care plan section) Acute Rehab OT Goals Patient Stated Goal: go home OT Goal Formulation: With patient/family Time For Goal Achievement: 03/15/16 Potential to Achieve Goals: Good ADL Goals Pt Will Perform Grooming: with supervision;with set-up;standing Pt Will Perform Lower Body Bathing: with min assist;with caregiver independent in assisting Pt Will Perform Lower Body Dressing: with caregiver independent in assisting;with min assist Pt Will Transfer to Toilet: with min guard assist;with supervision;ambulating;regular height toilet;grab bars (3 in 1) Pt Will Perform Toileting - Clothing Manipulation and hygiene: with min guard assist;sit to/from stand;with caregiver independent in assisting Pt Will Perform Tub/Shower Transfer: with min guard assist;tub bench;3 in 1;with caregiver independent in assisting Additional ADL Goal #1: Energy conservation education with pt being able to recall and demo techniques during ADL and ADL mobility  OT Frequency: Min 2X/week   Barriers to D/C:    no barriers                     End of Session Equipment Utilized During Treatment: Gait belt;Rolling walker;Other (comment) (3 in 2) CPM Right Knee CPM Right Knee: Off  Activity Tolerance: Patient tolerated treatment well Patient left: in chair;with call bell/phone within reach;with family/visitor present   Time:  [id number] OT Time Calculation (min): 30 min Charges:  OT General Charges $OT Visit: 1 Procedure OT Evaluation $OT Eval Moderate Complexity: 1 Procedure OT Treatments $Therapeutic Activity: 8-22 mins G-Codes:    Galen ManilaSpencer, Ziyah Cordoba Jeanette 03/08/2016, 2:18 PM

## 2016-03-08 NOTE — Progress Notes (Signed)
Subjective: 1 Day Post-Op Procedure(s) (LRB): TOTAL KNEE ARTHROPLASTY (Right) Patient reports pain as 4 on 0-10 scale.    Objective: Vital signs in last 24 hours: Temp:  [97 F (36.1 C)-99.3 F (37.4 C)] 99.3 F (37.4 C) (11/07 0430) Pulse Rate:  [57-90] 90 (11/07 0430) Resp:  [11-20] 17 (11/07 0430) BP: (109-167)/(48-69) 159/55 (11/07 0430) SpO2:  [92 %-100 %] 95 % (11/07 0430) Weight:  [106.2 kg (234 lb 3.2 oz)] 106.2 kg (234 lb 3.2 oz) (11/06 0915)  Intake/Output from previous day: 11/06 0701 - 11/07 0700 In: 1300 [I.V.:1200; IV Piggyback:100] Out: 1200 [Urine:950; Blood:250] Intake/Output this shift: No intake/output data recorded.   Recent Labs  03/07/16 1554 03/08/16 0753  HGB 11.8* 10.7*    Recent Labs  03/07/16 1554 03/08/16 0753  WBC 6.4 9.6  RBC 4.18 3.74*  HCT 37.8 34.0*  PLT 204 203    Recent Labs  03/07/16 1554  CREATININE 1.18*   No results for input(s): LABPT, INR in the last 72 hours.  ABD soft Neurovascular intact Sensation intact distally Intact pulses distally Dorsiflexion/Plantar flexion intact Incision: dressing C/D/I  Assessment/Plan: 1 Day Post-Op Procedure(s) (LRB): TOTAL KNEE ARTHROPLASTY (Right)  Principal Problem:   Primary localized osteoarthrosis of the knee, right Active Problems:   Hypertension   Thyroid disease   COPD (chronic obstructive pulmonary disease) (HCC)   Bronchitis, chronic obstructive, with exacerbation (HCC)   S/P total knee arthroplasty, left   GERD (gastroesophageal reflux disease)   Primary localized osteoarthritis of right knee   Low O2 saturation  Advance diet Up with therapy D/C IV fluids  Patient is having difficulty maintaining her O2 sats.  She will need to continue with O2 sat monitoring and O2 to keep sats at 90 or above.  I have restarted her Spiriva inhaler and will continue her Decadron in hopes of treating her COPD to achive O2 sat above 90 off of O2   Please monitor O2 when  ambulating with physical therapy.  Camillia Marcy J 03/08/2016, 8:50 AM

## 2016-03-08 NOTE — Care Management Note (Signed)
Case Management Note  Patient Details  Name: Valerie Beck MRN: [id number] Date of Birth: 1940/10/04  Subjective/Objective:   75 yr old female s/p right total knee arthroplasty.                 Action/Plan: [facility], no changes. Rolling walker, 3in1 and CPM have been delivered to her home. Patient will have assistance at discharge.    Expected Discharge Date:    03/09/16              Expected Discharge Plan:  Home w Home Health Services  In-House Referral:  NA  Discharge planning Services  CM Consult  Post Acute Care Choice:  Home Health Choice offered to:  Patient  DME Arranged:  N/A DME Agency:  NA  HH Arranged:  PT [facility]  Status of Service:  Completed, signed off  If discussed at [employer] Length of Stay Meetings, dates discussed:    Additional Comments:  [name], [name], RN 03/08/2016, 2:44 PM

## 2016-03-08 NOTE — Evaluation (Signed)
Physical Therapy Evaluation Patient Details Name: Valerie Beck MRN: [id number] DOB: 25-May-1940 Today's Date: 03/08/2016   History of Present Illness  Patient is a 75 y/o female s/p R TKA. PMH positive for COPD, GERD, HTN and L TKA.  Clinical Impression  Patient presents with decreased independence with mobility due to deficits listed in PT problem list.  She will benefit from skilled PT in the acute setting to allow return home with family support and follow up HHPT.     Follow Up Recommendations Home health PT;Supervision/Assistance - 24 hour    Equipment Recommendations  None recommended by PT    Recommendations for Other Services       Precautions / Restrictions Precautions Precautions: Fall;Knee Precaution Comments: reviewd no pillow under R knee with pt and her daughter Restrictions Weight Bearing Restrictions: Yes RLE Weight Bearing: Weight bearing as tolerated      Mobility  Bed Mobility Overal bed mobility: Needs Assistance Bed Mobility: Sit to Supine     Supine to sit: HOB elevated;Supervision Sit to supine: Min guard   General bed mobility comments: Assist for safety with getting legs into bed  Transfers Overall transfer level: Needs assistance Equipment used: Rolling walker (2 wheeled) Transfers: Sit to/from Stand Sit to Stand: Min guard         General transfer comment: from recliner and 3:1 with cues for technique  Ambulation/Gait Ambulation/Gait assistance: Min assist Ambulation Distance (Feet): 75 Feet Assistive device: Rolling walker (2 wheeled) Gait Pattern/deviations: Step-through pattern;Antalgic;Decreased stride length     General Gait Details: assist for balance, encouragement for distance, daughter followed with chair  Stairs            Wheelchair Mobility    Modified Rankin (Stroke Patients Only)       Balance Overall balance assessment: Needs assistance   Sitting balance-Leahy Scale: Good     Standing balance  support: Bilateral upper extremity supported;During functional activity Standing balance-Leahy Scale: Poor Standing balance comment: UE support for balance, able to don/doff briefs with one hand support and minguard A                             Pertinent Vitals/Pain Pain Assessment: 0-10 Pain Score: 4  Pain Location: R knee Pain Descriptors / Indicators: Aching;Sore Pain Intervention(s): Monitored during session;Premedicated before session;Repositioned;Ice applied    Home Living Family/patient expects to be discharged to:: Private residence Living Arrangements: Spouse/significant other;Other relatives Available Help at Discharge: Family;Available 24 hours/day Type of Home: House Home Access: Stairs to enter Entrance Stairs-Rails: Right;Left;Can reach both Entrance Stairs-Number of Steps: 2 Home Layout: One level Home Equipment: Cane - single point;Walker - 4 wheels;Bedside commode;Toilet riser      Prior Function Level of Independence: Independent               Hand Dominance   Dominant Hand: Right    Extremity/Trunk Assessment   Upper Extremity Assessment: Defer to OT evaluation           Lower Extremity Assessment: RLE deficits/detail RLE Deficits / Details: Ankle AROM WFL, knee AAROM about 0-50, strength at least 3/5 knee extension       Communication   Communication: No difficulties  Cognition Arousal/Alertness: Awake/alert Behavior During Therapy: WFL for tasks assessed/performed Overall Cognitive Status: Within Functional Limits for tasks assessed                      General Comments  Exercises Total Joint Exercises Ankle Circles/Pumps: AROM;10 reps;Both;Supine Quad Sets: AROM;Right;10 reps;Supine Short Arc Quad: AROM;Right;10 reps;Supine Heel Slides: AROM;AAROM;Right;10 reps;Supine   Assessment/Plan    PT Assessment Patient needs continued PT services  PT Problem List Decreased strength;Decreased range of  motion;Decreased activity tolerance;Decreased balance;Decreased knowledge of use of DME;Pain;Decreased mobility;Decreased safety awareness          PT Treatment Interventions DME instruction;Gait training;Stair training;Balance training;Functional mobility training;Therapeutic exercise;Therapeutic activities;Patient/family education    PT Goals (Current goals can be found in the Care Plan section)  Acute Rehab PT Goals Patient Stated Goal: go home PT Goal Formulation: With patient Time For Goal Achievement: 03/11/16 Potential to Achieve Goals: Good    Frequency 7X/week   Barriers to discharge        Co-evaluation               End of Session Equipment Utilized During Treatment: Gait belt;Oxygen Activity Tolerance: Patient limited by fatigue Patient left: in chair;with call bell/phone within reach;with family/visitor present           Time: [id number] PT Time Calculation (min) (ACUTE ONLY): 36 min   Charges:   PT Evaluation $PT Eval Moderate Complexity: 1 Procedure PT Treatments $Gait Training: 8-22 mins   PT G CodesElray Mcgregor:        Cynthia Wynn 03/08/2016, 4:26 PM Sheran Lawlessyndi Wynn, PT [phone] 03/08/2016

## 2016-03-09 LAB — CBC
HCT: 29.9 % — ABNORMAL LOW (ref 36.0–46.0)
HEMOGLOBIN: 9.4 g/dL — AB (ref 12.0–15.0)
MCH: 28.5 pg (ref 26.0–34.0)
MCHC: 31.4 g/dL (ref 30.0–36.0)
MCV: 90.6 fL (ref 78.0–100.0)
PLATELETS: 196 10*3/uL (ref 150–400)
RBC: 3.3 MIL/uL — AB (ref 3.87–5.11)
RDW: 13.4 % (ref 11.5–15.5)
WBC: 10.6 10*3/uL — ABNORMAL HIGH (ref 4.0–10.5)

## 2016-03-09 LAB — BASIC METABOLIC PANEL
Anion gap: 8 (ref 5–15)
BUN: 28 mg/dL — AB (ref 6–20)
CHLORIDE: 104 mmol/L (ref 101–111)
CO2: 25 mmol/L (ref 22–32)
Calcium: 8.9 mg/dL (ref 8.9–10.3)
Creatinine, Ser: 1.41 mg/dL — ABNORMAL HIGH (ref 0.44–1.00)
GFR calc Af Amer: 42 mL/min — ABNORMAL LOW (ref >60)
GFR calc non Af Amer: 36 mL/min — ABNORMAL LOW (ref >60)
GLUCOSE: 200 mg/dL — AB (ref 65–99)
POTASSIUM: 5.2 mmol/L — AB (ref 3.5–5.1)
Sodium: 137 mmol/L (ref 135–145)

## 2016-03-09 MED ORDER — MOMETASONE FURO-FORMOTEROL FUM 100-5 MCG/ACT IN AERO: 2.0000 | INHALATION_SPRAY | Freq: Two times a day (BID) | RESPIRATORY_TRACT | 12 refills | Status: AC

## 2016-03-09 MED ORDER — DOCUSATE SODIUM 100 MG PO CAPS: ORAL_CAPSULE | ORAL | 0 refills | Status: AC

## 2016-03-09 MED ORDER — POLYETHYLENE GLYCOL 3350 17 G PO PACK: PACK | ORAL | 0 refills | Status: AC

## 2016-03-09 MED ORDER — ASPIRIN EC 325 MG PO TBEC: DELAYED_RELEASE_TABLET | ORAL | 0 refills | Status: AC

## 2016-03-09 MED ORDER — OXYCODONE HCL 5 MG PO TABS: ORAL_TABLET | ORAL | 0 refills | Status: AC

## 2016-03-09 MED ORDER — TIOTROPIUM BROMIDE MONOHYDRATE 18 MCG IN CAPS: 18.0000 ug | ORAL_CAPSULE | Freq: Every day | RESPIRATORY_TRACT | 6 refills | Status: AC

## 2016-03-09 NOTE — Progress Notes (Signed)
Physical Therapy Treatment Patient Details Name: Valerie Beck MRN: [id number] DOB: 10-Apr-1941 Today's Date: 03/09/2016    History of Present Illness Patient is a 75 y/o female s/p R TKA. PMH positive for COPD, GERD, HTN and L TKA.    PT Comments    Patient continues to progress with mobility. Tolerated increase in gait distance this session. Current plan remains appropriate.   Follow Up Recommendations  Home health PT;Supervision/Assistance - 24 hour     Equipment Recommendations  None recommended by PT    Recommendations for Other Services       Precautions / Restrictions Precautions Precautions: Knee;Fall Precaution Comments: pt has good understanding of precautions Restrictions Weight Bearing Restrictions: Yes RLE Weight Bearing: Weight bearing as tolerated    Mobility  Bed Mobility Overal bed mobility: Modified Independent Bed Mobility: Sit to Supine           General bed mobility comments: Pt OOB in chair upon arrival  Transfers Overall transfer level: Needs assistance Equipment used: Rolling walker (2 wheeled) Transfers: Sit to/from Stand Sit to Stand: Supervision         General transfer comment: safe hand placement and technique  Ambulation/Gait Ambulation/Gait assistance: Supervision Ambulation Distance (Feet): 220 Feet Assistive device: Rolling walker (2 wheeled) Gait Pattern/deviations: Step-through pattern;Decreased stance time - right;Decreased step length - left;Decreased stride length;Decreased weight shift to right Gait velocity: decreased   General Gait Details: cues for step length symmetry, posture, and bilat heel strike; pt with improved step through pattern with distance   Stairs Stairs: Yes Stairs assistance: Min guard Stair Management: Two rails Number of Stairs: 2 General stair comments: carry over of sequencing; cues for engagement of R quad prior to BJ'sWB   Wheelchair Mobility    Modified Rankin (Stroke Patients Only)        Balance     Sitting balance-Leahy Scale: Good       Standing balance-Leahy Scale: Fair                      Cognition Arousal/Alertness: Awake/alert Behavior During Therapy: WFL for tasks assessed/performed Overall Cognitive Status: Within Functional Limits for tasks assessed                      Exercises Total Joint Exercises Quad Sets: AROM;Right;10 reps;Seated Heel Slides: AROM;Right;10 reps;Seated Hip ABduction/ADduction: AROM;Right;10 reps;Seated Straight Leg Raises: AROM;Right;10 reps;Seated Long Arc Quad: AROM;Right;15 reps;Seated Knee Flexion: AROM;Right;5 reps;Other (comment);Seated (10 sec holds) Goniometric ROM: 0-65    General Comments General comments (skin integrity, edema, etc.): pt given HEP handout       Pertinent Vitals/Pain Pain Assessment: Faces Pain Score: 3  Faces Pain Scale: Hurts little more Pain Location: R knee Pain Descriptors / Indicators: Aching;Grimacing;Guarding;Sore;Tightness Pain Intervention(s): Limited activity within patient's tolerance;Monitored during session;Repositioned;Patient requesting pain meds-RN notified    Home Living                      Prior Function            PT Goals (current goals can now be found in the care plan section) Acute Rehab PT Goals Patient Stated Goal: go home Progress towards PT goals: Progressing toward goals    Frequency    7X/week      PT Plan Current plan remains appropriate    Co-evaluation             End of Session Equipment Utilized During Treatment: Gait  belt Activity Tolerance: Patient tolerated treatment well Patient left: with call bell/phone within reach;with family/visitor present;in chair     Time: 1315-1330 PT Time Calculation (min) (ACUTE ONLY): 15 min  Charges:  $Gait Training: 8-22 mins                     G Codes:      Derek MoundKellyn R Daymen Hassebrock Fradel Baldonado, PTA Pager: [phone]   03/09/2016, 1:38 PM

## 2016-03-09 NOTE — Progress Notes (Signed)
Occupational Therapy Treatment Patient Details Name: Margaretha SheffieldLynda C Level MRN: [id number] DOB: April 26, 1941 Today's Date: 03/09/2016    History of present illness Patient is a 75 y/o female s/p R TKA. PMH positive for COPD, GERD, HTN and L TKA.   OT comments  Pt making good progress with functional goals. Pt participated in grooming, bathing and dressing seated/standing at sink. Pt to d/c home today  Follow Up Recommendations  No OT follow up;Supervision - Intermittent    Equipment Recommendations  None recommended by OT    Recommendations for Other Services      Precautions / Restrictions Precautions Precautions: Knee;Fall Precaution Comments: reviewed precautions with pt and husband Restrictions Weight Bearing Restrictions: Yes RLE Weight Bearing: Weight bearing as tolerated       Mobility Bed Mobility Overal bed mobility: Modified Independent Bed Mobility: Sit to Supine           General bed mobility comments: Pt seated on 3 in 1 at sink in bathroom upon entering room  Transfers Overall transfer level: Needs assistance Equipment used: Rolling walker (2 wheeled) Transfers: Sit to/from Stand Sit to Stand: Supervision         General transfer comment: supervision for safety; cues for hand placement     Balance     Sitting balance-Leahy Scale: Good       Standing balance-Leahy Scale: Fair                     ADL Overall ADL's : Needs assistance/impaired     Grooming: Wash/dry hands;Wash/dry face;Standing;Set up;Supervision/safety       Lower Body Bathing: Minimal assistance;Sit to/from stand       Lower Body Dressing: Minimal assistance;Sit to/from stand   Toilet Transfer: Comfort height toilet;RW;Ambulation;Supervision/safety   Toileting- Clothing Manipulation and Hygiene: Supervision/safety;Sit to/from Nurse, children'sstand     Tub/Shower Transfer Details (indicate cue type and reason): pt states that she will sink bathe until she us able to step into  tub shower Functional mobility during ADLs: Supervision/safety;Rolling walker                  Cognition   Behavior During Therapy: WFL for tasks assessed/performed Overall Cognitive Status: Within Functional Limits for tasks assessed                       Extremity/Trunk Assessment   WFL                       General Comments  Pt pleasant and cooperative    Pertinent Vitals/ Pain       Pain Assessment: 0-10 Pain Score: 3  Faces Pain Scale: Hurts little more Pain Location: R knee Pain Descriptors / Indicators: Sore;Aching Pain Intervention(s): Monitored during session;Premedicated before session;Repositioned                                                          Frequency  Min 2X/week        Progress Toward Goals  OT Goals(current goals can now be found in the care plan section)  Progress towards OT goals: Progressing toward goals  Acute Rehab OT Goals Patient Stated Goal: go home  Plan Discharge plan remains appropriate  End of Session Equipment Utilized During Treatment: Rolling walker;Other (comment) (3 in 1)   Activity Tolerance Patient tolerated treatment well   Patient Left in chair;with call bell/phone within reach             Time: [id number] OT Time Calculation (min): 19 min  Charges: OT General Charges $OT Visit: 1 Procedure OT Treatments $Self Care/Home Management : 8-22 mins  Margaretmary EddySpencer, Kashish Yglesias [facility] CenterJeanette 03/09/2016, 11:11 AM

## 2016-03-09 NOTE — Discharge Summary (Signed)
Patient ID: Valerie Beck MRN: [id number] DOB/AGE: 73-Dec-1942 73 y.o.  Admit date: 03/07/2016 Discharge date: 03/09/2016  Admission Diagnoses:  Principal Problem:   Primary localized osteoarthrosis of the knee, right Active Problems:   Hypertension   Thyroid disease   COPD (chronic obstructive pulmonary disease) (HCC)   Bronchitis, chronic obstructive, with exacerbation (HCC)   S/P total knee arthroplasty, left   GERD (gastroesophageal reflux disease)   Primary localized osteoarthritis of right knee   Low O2 saturation   Discharge Diagnoses:  Same  Past Medical History:  Diagnosis Date  . COPD (chronic obstructive pulmonary disease) (HCC)   . Dyspnea 03/25/2013   exertion - doesnt take spiriva every day  . Endometrial polyp 07/2005   HYSTEROSCOPY, D&C  . Family history of adverse reaction to anesthesia    SISTER ASPIRATED IN SURGERY 15 YRS AGO  . GERD (gastroesophageal reflux disease)   . History of palpitations    DOESNT FEEL THEM  . History of rectal bleeding    rectal tear related  . Hypertension   . Hypothyroidism   . Kidney insufficiency    WAS TOLD TO WATCH ANTI-INFLAMMATORIES  . Low O2 saturation 03/08/2016  . Morbid obesity (HCC) 03/25/2013  . Primary localized osteoarthritis of left knee   . Primary localized osteoarthrosis of the knee, right   . Reflux   . 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 Thyroid disease    Hypothyroid  . Urinary incontinence     Surgeries: Procedure(s): TOTAL KNEE ARTHROPLASTY on 03/07/2016   Consultants:   Discharged Condition: Improved  Hospital Course: Valerie Beck is an 75 y.o. female who was admitted 03/07/2016 for operative treatment ofPrimary localized osteoarthrosis of the knee, right. Patient has severe unremitting pain that affects sleep, daily activities, and work/hobbies. After pre-op clearance the patient was taken to the operating room on 03/07/2016 and underwent  Procedure(s): TOTAL KNEE ARTHROPLASTY.     Patient was given perioperative antibiotics: Anti-infectives    Start     Dose/Rate Route Frequency Ordered Stop   03/07/16 1800  ceFAZolin (ANCEF) IVPB 2g/100 mL premix     2 g 200 mL/hr over 30 Minutes Intravenous Every 6 hours 03/07/16 1458 03/07/16 2345   03/07/16 1245  cefUROXime (ZINACEF) injection  Status:  Discontinued       As needed 03/07/16 1246 03/07/16 1339   03/07/16 1217  cefUROXime (ZINACEF) injection  Status:  Discontinued       As needed 03/07/16 1219 03/07/16 1339   03/07/16 0930  vancomycin (VANCOCIN) 1,500 mg in sodium chloride 0.9 % 500 mL IVPB     1,500 mg 250 mL/hr over 120 Minutes Intravenous To [facility] 03/04/16 1010 03/07/16 1300       Patient was given sequential compression devices, early ambulation, and chemoprophylaxis to prevent DVT.  Patient benefited maximally from hospital stay and there were no complications.    Recent vital signs: Patient Vitals for the past 24 hrs:  BP Temp Temp src Pulse Resp SpO2  03/09/16 0527 (!) 124/44 98.5 F (36.9 C) Oral 68 16 97 %  03/08/16 2041 - - - - - 95 %  03/08/16 1945 (!) 122/51 98.5 F (36.9 C) Oral 74 16 100 %  03/08/16 1817 (!) 122/50 - - 79 - -  03/08/16 1543 - - - - - 93 %  03/08/16 1300 131/71 98.2 F (36.8 C) Oral - 16 92 %  03/08/16 1220 131/71 98.2 F (36.8 C) Oral - 16 92 %  Recent laboratory studies:  Recent Labs  03/07/16 1554 03/08/16 0753 03/09/16 0808  WBC 6.4 9.6 10.6*  HGB 11.8* 10.7* 9.4*  HCT 37.8 34.0* 29.9*  PLT 204 203 196  NA  --  138  --   K  --  4.9  --   CL  --  107  --   CO2  --  25  --   BUN  --  19  --   CREATININE 1.18* 1.17*  --   GLUCOSE  --  178*  --   CALCIUM  --  8.9  --      Discharge Medications:     Medication List    STOP taking these medications   GLUCOSAMINE CHOND COMPLEX/MSM PO   SUPER B COMPLEX PO     TAKE these medications   acetaminophen 650 MG CR tablet Commonly known as:  TYLENOL Take 1,300 mg by mouth daily after  breakfast.   aspirin EC 325 MG tablet 1 tab a day for the next 30 days to prevent blood clots   docusate sodium 100 MG capsule Commonly known as:  COLACE 1 tab 2 times a day while on narcotics.  STOOL SOFTENER What changed:  how much to take  how to take this  when to take this  additional instructions   famotidine 20 MG tablet Commonly known as:  PEPCID Take 20 mg by mouth 2 (two) times daily.   fluticasone 50 MCG/ACT nasal spray Commonly known as:  FLONASE Place 2 sprays into both nostrils daily as needed for allergies or rhinitis.   furosemide 40 MG tablet Commonly known as:  LASIX Take 40 mg by mouth daily.   hydrocortisone cream 1 % Apply 1 application topically daily.   levothyroxine 100 MCG tablet Commonly known as:  SYNTHROID, LEVOTHROID Take 100 mcg by mouth daily before breakfast.   loratadine 10 MG tablet Commonly known as:  CLARITIN Take 10 mg by mouth daily.   metoprolol 100 MG tablet Commonly known as:  LOPRESSOR Take 100 mg by mouth every evening.   mometasone-formoterol 100-5 MCG/ACT Aero Commonly known as:  DULERA Inhale 2 puffs into the lungs 2 (two) times daily.   oxyCODONE 5 MG immediate release tablet Commonly known as:  Oxy IR/ROXICODONE 1-2 tablets every 4-6 hrs as needed for pain   polyethylene glycol packet Commonly known as:  MIRALAX / GLYCOLAX 1 packet twice a day until bowels are moving regularly   pravastatin 80 MG tablet Commonly known as:  PRAVACHOL Take 80 mg by mouth daily.   tiotropium 18 MCG inhalation capsule Commonly known as:  SPIRIVA HANDIHALER Place 1 capsule (18 mcg total) into inhaler and inhale daily.   Vitamin D 2000 units Caps Take 2,000 Units by mouth daily.       Diagnostic Studies: No results found.  Disposition: 01-Home or Self Care  Discharge Instructions    CPM    Complete by:  As directed    Continuous passive motion machine (CPM):      Use the CPM from 0 to 90 for 6 hours per day.        You may break it up into 2 or 3 sessions per day.      Use CPM for 2 weeks or until you are told to stop.   Call MD / Call 911    Complete by:  As directed    If you experience chest pain or shortness of breath, CALL 911 and be transported to  the hospital emergency room.  If you develope a fever above 101 F, pus (white drainage) or increased drainage or redness at the wound, or calf pain, call your surgeon's office.   Change dressing    Complete by:  As directed    Change the gauze dressing daily with sterile 4 x 4 inch gauze and apply TED hose.  DO NOT REMOVE BANDAGE OVER SURGICAL INCISION.  WASH WHOLE LEG INCLUDING OVER THE WATERPROOF BANDAGE WITH SOAP AND WATER EVERY DAY.   Constipation Prevention    Complete by:  As directed    Drink plenty of fluids.  Prune juice may be helpful.  You may use a stool softener, such as Colace (over the counter) 100 mg twice a day.  Use MiraLax (over the counter) for constipation as needed.   Diet - low sodium heart healthy    Complete by:  As directed    Discharge instructions    Complete by:  As directed    INSTRUCTIONS AFTER JOINT REPLACEMENT   Remove items at home which could result in a fall. This includes throw rugs or furniture in walking pathways ICE to the affected joint every three hours while awake for 30 minutes at a time, for at least the first 3-5 days, and then as needed for pain and swelling.  Continue to use ice for pain and swelling. You may notice swelling that will progress down to the foot and ankle.  This is normal after surgery.  Elevate your leg when you are not up walking on it.   Continue to use the breathing machine you got in the hospital (incentive spirometer) which will help keep your temperature down.  It is common for your temperature to cycle up and down following surgery, especially at night when you are not up moving around and exerting yourself.  The breathing machine keeps your lungs expanded and your temperature  down.   DIET:  As you were doing prior to hospitalization, we recommend a well-balanced diet.  DRESSING / WOUND CARE / SHOWERING  Keep the surgical dressing until follow up.  The dressing is water proof, so you can shower without any extra covering.  IF THE DRESSING FALLS OFF or the wound gets wet inside, change the dressing with sterile gauze.  Please use good hand washing techniques before changing the dressing.  Do not use any lotions or creams on the incision until instructed by your surgeon.    ACTIVITY  Increase activity slowly as tolerated, but follow the weight bearing instructions below.   No driving for 6 weeks or until further direction given by your physician.  You cannot drive while taking narcotics.  No lifting or carrying greater than 10 lbs. until further directed by your surgeon. Avoid periods of inactivity such as sitting longer than an hour when not asleep. This helps prevent blood clots.  You may return to work once you are authorized by your doctor.     WEIGHT BEARING   Weight bearing as tolerated with assist device (walker, cane, etc) as directed, use it as long as suggested by your surgeon or therapist, typically at least 2-3 weeks.   EXERCISES  Results after joint replacement surgery are often greatly improved when you follow the exercise, range of motion and muscle strengthening exercises prescribed by your doctor. Safety measures are also important to protect the joint from further injury. Any time any of these exercises cause you to have increased pain or swelling, decrease what you are doing  until you are comfortable again and then slowly increase them. If you have problems or questions, call your caregiver or physical therapist for advice.   Rehabilitation is important following a joint replacement. After just a few days of immobilization, the muscles of the leg can become weakened and shrink (atrophy).  These exercises are designed to build up the tone and  strength of the thigh and leg muscles and to improve motion. Often times heat used for twenty to thirty minutes before working out will loosen up your tissues and help with improving the range of motion but do not use heat for the first two weeks following surgery (sometimes heat can increase post-operative swelling).   These exercises can be done on a training (exercise) mat, on the floor, on a table or on a bed. Use whatever works the best and is most comfortable for you.    Use music or television while you are exercising so that the exercises are a pleasant break in your day. This will make your life better with the exercises acting as a break in your routine that you can look forward to.   Perform all exercises about fifteen times, three times per day or as directed.  You should exercise both the operative leg and the other leg as well.   Exercises include:  Quad Sets - Tighten up the muscle on the front of the thigh (Quad) and hold for 5-10 seconds.   Straight Leg Raises - With your knee straight (if you were given a brace, keep it on), lift the leg to 60 degrees, hold for 3 seconds, and slowly lower the leg.  Perform this exercise against resistance later as your leg gets stronger.  Leg Slides: Lying on your back, slowly slide your foot toward your buttocks, bending your knee up off the floor (only go as far as is comfortable). Then slowly slide your foot back down until your leg is flat on the floor again.  Angel Wings: Lying on your back spread your legs to the side as far apart as you can without causing discomfort.  Hamstring Strength:  Lying on your back, push your heel against the floor with your leg straight by tightening up the muscles of your buttocks.  Repeat, but this time bend your knee to a comfortable angle, and push your heel against the floor.  You may put a pillow under the heel to make it more comfortable if necessary.   A rehabilitation program following joint replacement  surgery can speed recovery and prevent re-injury in the future due to weakened muscles. Contact your doctor or a physical therapist for more information on knee rehabilitation.    CONSTIPATION  Constipation is defined medically as fewer than three stools per week and severe constipation as less than one stool per week.  Even if you have a regular bowel pattern at home, your normal regimen is likely to be disrupted due to multiple reasons following surgery.  Combination of anesthesia, postoperative narcotics, change in appetite and fluid intake all can affect your bowels.   YOU MUST use at least one of the following options; they are listed in order of increasing strength to get the job done.  They are all available over the counter, and you may need to use some, POSSIBLY even all of these options:    Drink plenty of fluids (prune juice may be helpful) and high fiber foods Colace 100 mg by mouth twice a day  Senokot for constipation as directed  and as needed Dulcolax (bisacodyl), take with full glass of water  Miralax (polyethylene glycol) once or twice a day as needed.  If you have tried all these things and are unable to have a bowel movement in the first 3-4 days after surgery call either your surgeon or your primary doctor.    If you experience loose stools or diarrhea, hold the medications until you stool forms back up.  If your symptoms do not get better within 1 week or if they get worse, check with your doctor.  If you experience "the worst abdominal pain ever" or develop nausea or vomiting, please contact the office immediately for further recommendations for treatment.   ITCHING:  If you experience itching with your medications, try taking only a single pain pill, or even half a pain pill at a time.  You can also use Benadryl over the counter for itching or also to help with sleep.   TED HOSE STOCKINGS:  Use stockings on both legs until for at least 2 weeks or as directed by physician  office. They may be removed at night for sleeping.  MEDICATIONS:  See your medication summary on the "After Visit Summary" that nursing will review with you.  You may have some home medications which will be placed on hold until you complete the course of blood thinner medication.  It is important for you to complete the blood thinner medication as prescribed.  PRECAUTIONS:  If you experience chest pain or shortness of breath - call 911 immediately for transfer to the hospital emergency department.   If you develop a fever greater that 101 F, purulent drainage from wound, increased redness or drainage from wound, foul odor from the wound/dressing, or calf pain - CONTACT YOUR SURGEON.                                                   FOLLOW-UP APPOINTMENTS:  If you do not already have a post-op appointment, please call the office for an appointment to be seen by your surgeon.  Guidelines for how soon to be seen are listed in your "After Visit Summary", but are typically between 1-4 weeks after surgery.  OTHER INSTRUCTIONS:   Knee Replacement:  Do not place pillow under knee, focus on keeping the knee straight while resting. CPM instructions: 0-90 degrees, 2 hours in the morning, 2 hours in the afternoon, and 2 hours in the evening. Place foam block, curve side up under heel at all times except when in CPM or when walking.  DO NOT modify, tear, cut, or change the foam block in any way.  MAKE SURE YOU:  Understand these instructions.  Get help right away if you are not doing well or get worse.    Thank you for letting us be a part of your medical care team.  It is a privilege we respect greatly.  We hope these instructions will help you stay on track for a fast and full recovery!   Do not put a pillow under the knee. Place it under the heel.    Complete by:  As directed    Place gray foam block, curve side up under heel at all times except when in CPM or when walking.  DO NOT modify, tear, cut, or  change in any way the gray foam block.  Increase activity slowly as tolerated    Complete by:  As directed    Patient may shower    Complete by:  As directed    Aquacel dressing is water proof    Wash over it and the whole leg with soap and water at the end of your shower   TED hose    Complete by:  As directed    Use stockings (TED hose) for 2 weeks on both leg(s).  You may remove them at night for sleeping.      Follow-up Information    [facility] Follow up.   Specialty:  Home Health Services Why:  Someone from [facility] will contact you to arrange start date and time for therapy. Contact information: [address] [place] [address] [phone]        Nilda SimmerWAINER,ROBERT A, MD Follow up on 03/21/2016.   Specialty:  Orthopedic Surgery Contact information: [address] [place] [address] [phone]            Signed: Pascal LuxSHEPPERSON,Patrycja Mumpower J 03/09/2016, 9:01 AM

## 2016-03-09 NOTE — Progress Notes (Signed)
Physical Therapy Treatment Patient Details Name: Valerie Beck MRN: [id number] DOB: 05-02-1941 Today's Date: 03/09/2016    History of Present Illness Patient is a 75 y/o female s/p R TKA. PMH positive for COPD, GERD, HTN and L TKA.    PT Comments    Patient is progressing well toward mobility goals. Tolerated increased gait distance, stair training, and therex. Current plan remains appropriate.   Follow Up Recommendations  Home health PT;Supervision/Assistance - 24 hour     Equipment Recommendations  None recommended by PT    Recommendations for Other Services       Precautions / Restrictions Precautions Precautions: Knee;Fall Precaution Comments: reviewed precautions with pt and husband Restrictions Weight Bearing Restrictions: Yes RLE Weight Bearing: Weight bearing as tolerated    Mobility  Bed Mobility Overal bed mobility: Modified Independent Bed Mobility: Sit to Supine           General bed mobility comments: increased time and effort; HOB flat  Transfers Overall transfer level: Needs assistance Equipment used: Rolling walker (2 wheeled) Transfers: Sit to/from Stand Sit to Stand: Supervision         General transfer comment: supervision for safety; cues for hand placement   Ambulation/Gait Ambulation/Gait assistance: Supervision Ambulation Distance (Feet): 200 Feet Assistive device: Rolling walker (2 wheeled) Gait Pattern/deviations: Step-through pattern;Decreased stance time - right;Decreased step length - left;Decreased weight shift to right;Antalgic     General Gait Details: supervision for safety; cues for sequencing, bilat heel strike, and encouraged increased WB on R LE; worked on step length symmetry; pt with improved gait mechanics with increased distance   Stairs Stairs: Yes Stairs assistance: Min guard Stair Management: Two rails;One rail Right;Step to pattern;Forwards;Sideways Number of Stairs:  (2X2) General stair comments: cues for  sequencing and technique;   Wheelchair Mobility    Modified Rankin (Stroke Patients Only)       Balance     Sitting balance-Leahy Scale: Good       Standing balance-Leahy Scale: Fair                      Cognition Arousal/Alertness: Awake/alert Behavior During Therapy: WFL for tasks assessed/performed Overall Cognitive Status: Within Functional Limits for tasks assessed                      Exercises Total Joint Exercises Quad Sets: AROM;Right;10 reps;Seated Heel Slides: AROM;Right;10 reps;Seated Hip ABduction/ADduction: AROM;Right;10 reps;Seated Straight Leg Raises: AROM;Right;10 reps;Seated Long Arc Quad: AROM;Right;15 reps;Seated Knee Flexion: AROM;Right;5 reps;Other (comment);Seated (10 sec holds) Goniometric ROM: 0-65    General Comments General comments (skin integrity, edema, etc.): pt given HEP handout       Pertinent Vitals/Pain Pain Assessment: Faces Faces Pain Scale: Hurts little more Pain Location: R knee Pain Descriptors / Indicators: Aching;Guarding;Sore Pain Intervention(s): Limited activity within patient's tolerance;Monitored during session;Premedicated before session;Repositioned    Home Living                      Prior Function            PT Goals (current goals can now be found in the care plan section) Acute Rehab PT Goals Patient Stated Goal: go home Progress towards PT goals: Progressing toward goals    Frequency    7X/week      PT Plan Current plan remains appropriate    Co-evaluation             End of Session Equipment  Utilized During Treatment: Gait belt Activity Tolerance: Patient tolerated treatment well Patient left: in bed;in CPM;with call bell/phone within reach;with family/visitor present     Time: [id number] PT Time Calculation (min) (ACUTE ONLY): 40 min  Charges:  $Gait Training: 8-22 mins $Therapeutic Exercise: 8-22 mins $Therapeutic Activity: 8-22 mins                     G Codes:      Derek MoundKellyn R Nalayah Hitt Aryka Coonradt, PTA Pager: [phone]   03/09/2016, 10:44 AM

## 2016-03-09 NOTE — Progress Notes (Signed)
Review discharge papers and medications with Ms Valerie Beck and her husband with full understanding and D/C IV

## 2016-07-19 ENCOUNTER — Other Ambulatory Visit: Payer: Self-pay | Admitting: Family Medicine

## 2016-07-19 DIAGNOSIS — E2839 Other primary ovarian failure: Secondary | ICD-10-CM

## 2016-07-19 DIAGNOSIS — Z1231 Encounter for screening mammogram for malignant neoplasm of breast: Secondary | ICD-10-CM

## 2016-08-09 ENCOUNTER — Ambulatory Visit: Payer: Medicare Other

## 2016-08-09 ENCOUNTER — Other Ambulatory Visit: Payer: Medicare Other

## 2016-08-30 ENCOUNTER — Ambulatory Visit
Admission: RE | Admit: 2016-08-30 | Discharge: 2016-08-30 | Disposition: A | Discharge status: Home / Self Care | Payer: Medicare Other | Source: Ambulatory Visit | Attending: Family Medicine | Admitting: Family Medicine

## 2016-08-30 DIAGNOSIS — Z1231 Encounter for screening mammogram for malignant neoplasm of breast: Secondary | ICD-10-CM

## 2016-08-30 DIAGNOSIS — E2839 Other primary ovarian failure: Secondary | ICD-10-CM

## 2016-09-28 ENCOUNTER — Other Ambulatory Visit: Payer: Self-pay

## 2016-11-04 ENCOUNTER — Encounter (HOSPITAL_COMMUNITY): Payer: Medicare Other

## 2017-06-17 ENCOUNTER — Other Ambulatory Visit: Payer: Self-pay | Admitting: Family Medicine

## 2017-06-26 ENCOUNTER — Other Ambulatory Visit: Payer: Self-pay | Admitting: Family Medicine

## 2017-12-11 ENCOUNTER — Other Ambulatory Visit: Payer: Self-pay | Admitting: Family Medicine

## 2017-12-11 DIAGNOSIS — Z1231 Encounter for screening mammogram for malignant neoplasm of breast: Secondary | ICD-10-CM

## 2017-12-28 ENCOUNTER — Ambulatory Visit
Admission: RE | Admit: 2017-12-28 | Discharge: 2017-12-28 | Disposition: A | Discharge status: Home / Self Care | Payer: Medicare Other | Source: Ambulatory Visit | Attending: Family Medicine | Admitting: Family Medicine

## 2017-12-28 DIAGNOSIS — Z1231 Encounter for screening mammogram for malignant neoplasm of breast: Secondary | ICD-10-CM

## 2017-12-29 NOTE — Progress Notes (Signed)
Results faxed to Dr. Windle GuardWilson Elkins

## 2018-12-07 ENCOUNTER — Other Ambulatory Visit: Payer: Self-pay | Admitting: Family Medicine

## 2018-12-07 DIAGNOSIS — Z1231 Encounter for screening mammogram for malignant neoplasm of breast: Secondary | ICD-10-CM

## 2019-01-10 ENCOUNTER — Other Ambulatory Visit: Payer: Self-pay

## 2019-01-10 ENCOUNTER — Emergency Department (HOSPITAL_COMMUNITY)
Admission: EM | Admit: 2019-01-10 | Discharge: 2019-01-10 | Disposition: A | Discharge status: Other Acute Inpatient Hospital | Payer: Medicare Other | Attending: Emergency Medicine | Admitting: Emergency Medicine

## 2019-01-10 ENCOUNTER — Encounter (HOSPITAL_COMMUNITY): Payer: Self-pay

## 2019-01-10 ENCOUNTER — Emergency Department (HOSPITAL_COMMUNITY): Payer: Medicare Other

## 2019-01-10 DIAGNOSIS — R0602 Shortness of breath: Secondary | ICD-10-CM | POA: Diagnosis not present

## 2019-01-10 DIAGNOSIS — D709 Neutropenia, unspecified: Secondary | ICD-10-CM | POA: Diagnosis not present

## 2019-01-10 DIAGNOSIS — I10 Essential (primary) hypertension: Secondary | ICD-10-CM | POA: Insufficient documentation

## 2019-01-10 DIAGNOSIS — J449 Chronic obstructive pulmonary disease, unspecified: Secondary | ICD-10-CM | POA: Diagnosis not present

## 2019-01-10 DIAGNOSIS — Z96652 Presence of left artificial knee joint: Secondary | ICD-10-CM | POA: Diagnosis not present

## 2019-01-10 DIAGNOSIS — Z20828 Contact with and (suspected) exposure to other viral communicable diseases: Secondary | ICD-10-CM | POA: Diagnosis not present

## 2019-01-10 DIAGNOSIS — E039 Hypothyroidism, unspecified: Secondary | ICD-10-CM | POA: Insufficient documentation

## 2019-01-10 DIAGNOSIS — R197 Diarrhea, unspecified: Secondary | ICD-10-CM

## 2019-01-10 DIAGNOSIS — Z79899 Other long term (current) drug therapy: Secondary | ICD-10-CM | POA: Insufficient documentation

## 2019-01-10 DIAGNOSIS — R509 Fever, unspecified: Secondary | ICD-10-CM | POA: Diagnosis present

## 2019-01-10 LAB — CBC
HCT: 33 % — ABNORMAL LOW (ref 36.0–46.0)
Hemoglobin: 11 g/dL — ABNORMAL LOW (ref 12.0–15.0)
MCH: 31 pg (ref 26.0–34.0)
MCHC: 33.3 g/dL (ref 30.0–36.0)
MCV: 93 fL (ref 80.0–100.0)
Platelets: 152 10*3/uL (ref 150–400)
RBC: 3.55 MIL/uL — ABNORMAL LOW (ref 3.87–5.11)
RDW: 15.9 % — ABNORMAL HIGH (ref 11.5–15.5)
WBC: 1.2 10*3/uL — CL (ref 4.0–10.5)
nRBC: 0 % (ref 0.0–0.2)

## 2019-01-10 LAB — COMPREHENSIVE METABOLIC PANEL
ALT: 18 U/L (ref 0–44)
AST: 43 U/L — ABNORMAL HIGH (ref 15–41)
Albumin: 3.1 g/dL — ABNORMAL LOW (ref 3.5–5.0)
Alkaline Phosphatase: 100 U/L (ref 38–126)
Anion gap: 17 — ABNORMAL HIGH (ref 5–15)
BUN: 25 mg/dL — ABNORMAL HIGH (ref 8–23)
CO2: 24 mmol/L (ref 22–32)
Calcium: 8.8 mg/dL — ABNORMAL LOW (ref 8.9–10.3)
Chloride: 90 mmol/L — ABNORMAL LOW (ref 98–111)
Creatinine, Ser: 1.88 mg/dL — ABNORMAL HIGH (ref 0.44–1.00)
GFR calc Af Amer: 30 mL/min — ABNORMAL LOW (ref >60)
GFR calc non Af Amer: 25 mL/min — ABNORMAL LOW (ref >60)
Glucose, Bld: 143 mg/dL — ABNORMAL HIGH (ref 70–99)
Potassium: 3 mmol/L — ABNORMAL LOW (ref 3.5–5.1)
Sodium: 131 mmol/L — ABNORMAL LOW (ref 135–145)
Total Bilirubin: 1.2 mg/dL (ref 0.3–1.2)
Total Protein: 6.5 g/dL (ref 6.5–8.1)

## 2019-01-10 LAB — DIFFERENTIAL
Abs Immature Granulocytes: 0 10*3/uL (ref 0.00–0.07)
Basophils Absolute: 0 10*3/uL (ref 0.0–0.1)
Basophils Relative: 0 %
Blasts: 24 %
Eosinophils Absolute: 0 10*3/uL (ref 0.0–0.5)
Eosinophils Relative: 0 %
Lymphocytes Relative: 28 %
Lymphs Abs: 0.3 10*3/uL — ABNORMAL LOW (ref 0.7–4.0)
Monocytes Absolute: 0 10*3/uL — ABNORMAL LOW (ref 0.1–1.0)
Monocytes Relative: 4 %
Neutro Abs: 0.5 10*3/uL — ABNORMAL LOW (ref 1.7–7.7)
Neutrophils Relative %: 44 %

## 2019-01-10 LAB — BLOOD GAS, VENOUS
Acid-Base Excess: 1.4 mmol/L (ref 0.0–2.0)
Bicarbonate: 25.1 mmol/L (ref 20.0–28.0)
Drawn by: 331471
O2 Saturation: 92.5 %
Patient temperature: 98.6
pCO2, Ven: 38.7 mmHg — ABNORMAL LOW (ref 44.0–60.0)
pH, Ven: 7.428 (ref 7.250–7.430)
pO2, Ven: 66.8 mmHg — ABNORMAL HIGH (ref 32.0–45.0)

## 2019-01-10 LAB — PROTIME-INR
INR: 1.5 — ABNORMAL HIGH (ref 0.8–1.2)
Prothrombin Time: 17.8 seconds — ABNORMAL HIGH (ref 11.4–15.2)

## 2019-01-10 LAB — URIC ACID: Uric Acid, Serum: 5.8 mg/dL (ref 2.5–7.1)

## 2019-01-10 LAB — LACTIC ACID, PLASMA
Lactic Acid, Venous: 1 mmol/L (ref 0.5–1.9)
Lactic Acid, Venous: 1.1 mmol/L (ref 0.5–1.9)

## 2019-01-10 LAB — LIPASE, BLOOD: Lipase: 23 U/L (ref 11–51)

## 2019-01-10 LAB — APTT: aPTT: 38 seconds — ABNORMAL HIGH (ref 24–36)

## 2019-01-10 LAB — PATHOLOGIST SMEAR REVIEW

## 2019-01-10 LAB — SARS CORONAVIRUS 2 BY RT PCR (HOSPITAL ORDER, PERFORMED IN ~~LOC~~ HOSPITAL LAB): SARS Coronavirus 2: NEGATIVE

## 2019-01-10 LAB — LACTATE DEHYDROGENASE: LDH: 581 U/L — ABNORMAL HIGH (ref 98–192)

## 2019-01-10 MED ORDER — SODIUM CHLORIDE 0.9% FLUSH
3.0000 mL | Freq: Once | INTRAVENOUS | Status: AC
  Administered 2019-01-10: 3 mL via INTRAVENOUS

## 2019-01-10 MED ORDER — VANCOMYCIN HCL 10 G IV SOLR
2000.0000 mg | Freq: Once | INTRAVENOUS | Status: AC
  Administered 2019-01-10: 2000 mg via INTRAVENOUS
  Filled 2019-01-10: qty 2000

## 2019-01-10 MED ORDER — SODIUM CHLORIDE 0.9 % IV SOLN: 1.0000 g | Freq: Once | INTRAVENOUS | Status: DC

## 2019-01-10 MED ORDER — POTASSIUM CHLORIDE CRYS ER 20 MEQ PO TBCR
40.0000 meq | EXTENDED_RELEASE_TABLET | Freq: Once | ORAL | Status: AC
  Administered 2019-01-10: 40 meq via ORAL
  Filled 2019-01-10: qty 2

## 2019-01-10 MED ORDER — ACETAMINOPHEN 325 MG PO TABS
650.0000 mg | ORAL_TABLET | Freq: Once | ORAL | Status: AC | PRN
  Administered 2019-01-10: 650 mg via ORAL
  Filled 2019-01-10: qty 2

## 2019-01-10 MED ORDER — SODIUM CHLORIDE 0.9 % IV SOLN
2.0000 g | Freq: Once | INTRAVENOUS | Status: AC
  Administered 2019-01-10: 2 g via INTRAVENOUS
  Filled 2019-01-10: qty 2

## 2019-01-10 MED ORDER — SODIUM CHLORIDE 0.9 % IV BOLUS
500.0000 mL | Freq: Once | INTRAVENOUS | Status: AC
  Administered 2019-01-10: 500 mL via INTRAVENOUS

## 2019-01-10 NOTE — ED Notes (Signed)
Pt on 2L [place].  

## 2019-01-10 NOTE — Progress Notes (Signed)
A consult was received from an ED provider for vancomycin per pharmacy dosing.  The patient's profile has been reviewed for ht/wt/allergies/indication/available labs.    A one time order has been placed for vancomycin 2000 mg IV once.  Further antibiotics/pharmacy consults should be ordered by admitting physician if indicated.                       Thank you, Lenis Noon, PharmD 01/10/2019  3:18 PM

## 2019-01-10 NOTE — ED Triage Notes (Signed)
Patient reports that she had a temperature 103 orally at her PCP. Patient reports that she had diarrhea 2 days ago x 1 day only and vomited yesterday and today.\  Patient states she had a covid test at her physician's office today because her Sats were 90% at the physician's office.

## 2019-01-10 NOTE — ED Notes (Signed)
Pt placed on purewick 

## 2019-01-10 NOTE — ED Notes (Signed)
Critical wbc 1.2 reported by lab.  MD made aware

## 2019-01-10 NOTE — ED Notes (Signed)
ED TO INPATIENT HANDOFF REPORT  Name/Age/Gender Valerie Beck 78 y.o. female  Code Status Code Status History    Date Active Date Inactive Code Status Order ID Comments User Context   03/07/2016 1458 03/09/2016 1644 Full Code [id number]  Nilda Riggs Inpatient   06/02/2014 1203 06/04/2014 1823 Full Code [id number]  Linda Hedges, PA-C Inpatient   Advance Care Planning Activity    Advance Directive Documentation     Most Recent Value  Type of Advance Directive  Healthcare Power of Attorney  Pre-existing out of facility DNR order (yellow form or pink MOST form)  -  "MOST" Form in Place?  -      Home/SNF/Other Home  Chief Complaint emesis / fever   Level of Care/Admitting Diagnosis ED Disposition    None      Medical History Past Medical History:  Diagnosis Date  . COPD (chronic obstructive pulmonary disease) ([place])   . Dyspnea 03/25/2013   exertion - doesnt take spiriva every day  . Endometrial polyp 07/2005   HYSTEROSCOPY, D&C  . Family history of adverse reaction to anesthesia    SISTER ASPIRATED IN SURGERY 15 YRS AGO  . GERD (gastroesophageal reflux disease)   . History of palpitations    DOESNT FEEL THEM  . History of rectal bleeding    rectal tear related  . Hypertension   . Hypothyroidism   . Kidney insufficiency    WAS TOLD TO WATCH ANTI-INFLAMMATORIES  . Low O2 saturation 03/08/2016  . Morbid obesity ([place]) 03/25/2013  . Primary localized osteoarthritis of left knee   . Primary localized osteoarthrosis of the knee, right   . Reflux   . 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 Thyroid disease    Hypothyroid  . Urinary incontinence     Allergies Allergies  Allergen Reactions  . Penicillins Rash    PINPOINT BLISTERS  Has patient had a PCN reaction causing IMMEDIATE rash, facial/tongue/throat swelling, SOB or lightheadedness with hypotension: No Has patient had a PCN reaction causing severe rash involving mucus membranes or  skin necrosis: No Has patient had a PCN reaction that required hospitalization No Has patient had a PCN reaction occurring within the last 10 years: No If all of the above answers are "NO", then may proceed with Cephalosporin use.     IV Location/Drains/Wounds Patient Lines/Drains/Airways Status   Active Line/Drains/Airways    Name:   Placement date:   Placement time:   Site:   Days:   Peripheral IV 01/10/19 Right Forearm   01/10/19    1316    Forearm   less than 1   Airway   06/02/14    1040     1683   Incision (Closed) 06/02/14 Knee Left   06/02/14    1003     1683   Incision (Closed) 03/07/16 Knee   03/07/16    1310     1039          Labs/Imaging Results for orders placed or performed during the hospital encounter of 01/10/19 (from the past 48 hour(s))  Lipase, blood     Status: None   Collection Time: 01/10/19 12:49 PM  Result Value Ref Range   Lipase 23 11 - 51 U/L    Comment: Performed at [facility], [address], [place], [place] [address]  Comprehensive metabolic panel     Status: Abnormal   Collection Time: 01/10/19 12:49 PM  Result Value Ref Range   Sodium  131 (L) 135 - 145 mmol/L   Potassium 3.0 (L) 3.5 - 5.1 mmol/L   Chloride 90 (L) 98 - 111 mmol/L   CO2 24 22 - 32 mmol/L   Glucose, Bld 143 (H) 70 - 99 mg/dL   BUN 25 (H) 8 - 23 mg/dL   Creatinine, Ser 1.611.88 (H) 0.44 - 1.00 mg/dL   Calcium 8.8 (L) 8.9 - 10.3 mg/dL   Total Protein 6.5 6.5 - 8.1 g/dL   Albumin 3.1 (L) 3.5 - 5.0 g/dL   AST 43 (H) 15 - 41 U/L   ALT 18 0 - 44 U/L   Alkaline Phosphatase 100 38 - 126 U/L   Total Bilirubin 1.2 0.3 - 1.2 mg/dL   GFR calc non Af Amer 25 (L) >60 mL/min   GFR calc Af Amer 30 (L) >60 mL/min   Anion gap 17 (H) 5 - 15    Comment: Performed at [facility], [address] [place], [place] [address]  CBC     Status: Abnormal   Collection Time: 01/10/19 12:49 PM  Result Value Ref Range   WBC 1.2 (LL) 4.0 - 10.5 K/uL    Comment:  This critical result has verified and been called [facility] RN by Patrina LeveringNicole McCoy on 09 10 2020 at 1316, and has been read back. Critical Result Verified   RBC 3.55 (L) 3.87 - 5.11 MIL/uL   Hemoglobin 11.0 (L) 12.0 - 15.0 g/dL   HCT 54.033.0 (L) 98.136.0 - 19.146.0 %   MCV 93.0 80.0 - 100.0 fL   MCH 31.0 26.0 - 34.0 pg   MCHC 33.3 30.0 - 36.0 g/dL   RDW 47.815.9 (H) 29.511.5 - 62.115.5 %   Platelets 152 150 - 400 K/uL   nRBC 0.0 0.0 - 0.2 %    Comment: Performed at [facility], [address] [place], [place] [address]  Differential     Status: Abnormal   Collection Time: 01/10/19 12:49 PM  Result Value Ref Range   Neutrophils Relative % 44 %   Neutro Abs 0.5 (L) 1.7 - 7.7 K/uL   Lymphocytes Relative 28 %   Lymphs Abs 0.3 (L) 0.7 - 4.0 K/uL   Monocytes Relative 4 %   Monocytes Absolute 0.0 (L) 0.1 - 1.0 K/uL   Eosinophils Relative 0 %   Eosinophils Absolute 0.0 0.0 - 0.5 K/uL   Basophils Relative 0 %   Basophils Absolute 0.0 0.0 - 0.1 K/uL   Blasts 24 %   Abs Immature Granulocytes 0.00 0.00 - 0.07 K/uL    Comment: Performed at [facility], [address] [place], [place] [address]  Pathologist smear review     Status: None   Collection Time: 01/10/19 12:49 PM  Result Value Ref Range   Path Review 01/10/2019     Comment: CONCERNING FOR ACUTE LEUKEMIA Reviewed by Alvis Lemmingsawn L. Charm BargesButler, M.D. Performed at [facility], [address] [place], [place] [address]   Lactic acid, plasma     Status: None   Collection Time: 01/10/19 12:51 PM  Result Value Ref Range   Lactic Acid, Venous 1.0 0.5 - 1.9 mmol/L    Comment: Performed at [facility], [address] [place], [place] [address]  APTT     Status: Abnormal   Collection Time: 01/10/19 12:51 PM  Result Value Ref Range   aPTT 38 (H) 24 - 36 seconds    Comment:  IF BASELINE aPTT IS ELEVATED, SUGGEST PATIENT RISK ASSESSMENT BE USED TO DETERMINE APPROPRIATE ANTICOAGULANT  THERAPY. Performed at [facility], [address] [place], [place] [address]   Protime-INR     Status: Abnormal   Collection Time: 01/10/19 12:51 PM  Result Value Ref Range   Prothrombin Time 17.8 (H) 11.4 - 15.2 seconds   INR 1.5 (H) 0.8 - 1.2    Comment: (NOTE) INR goal varies based on device and disease states. Performed at [facility], [address] [place], [place] [address]   SARS Coronavirus 2 [facility] order, Performed in [facility] hospital lab) Nasopharyngeal Nasopharyngeal Swab     Status: None   Collection Time: 01/10/19 12:58 PM   Specimen: Nasopharyngeal Swab  Result Value Ref Range   SARS Coronavirus 2 NEGATIVE NEGATIVE    Comment: (NOTE) If result is NEGATIVE SARS-CoV-2 target nucleic acids are NOT DETECTED. The SARS-CoV-2 RNA is generally detectable in upper and lower  respiratory specimens during the acute phase of infection. The lowest  concentration of SARS-CoV-2 viral copies this assay can detect is 250  copies / mL. A negative result does not preclude SARS-CoV-2 infection  and should not be used as the sole basis for treatment or other  patient management decisions.  A negative result may occur with  improper specimen collection / handling, submission of specimen other  than nasopharyngeal swab, presence of viral mutation(s) within the  areas targeted by this assay, and inadequate number of viral copies  (<250 copies / mL). A negative result must be combined with clinical  observations, patient history, and epidemiological information. If result is POSITIVE SARS-CoV-2 target nucleic acids are DETECTED. The SARS-CoV-2 RNA is generally detectable in upper and lower  respiratory specimens dur ing the acute phase of infection.  Positive  results are indicative of active infection with SARS-CoV-2.  Clinical  correlation with patient history and other diagnostic information is  necessary to determine patient infection  status.  Positive results do  not rule out bacterial infection or co-infection with other viruses. If result is PRESUMPTIVE POSTIVE SARS-CoV-2 nucleic acids MAY BE PRESENT.   A presumptive positive result was obtained on the submitted specimen  and confirmed on repeat testing.  While 2019 novel coronavirus  (SARS-CoV-2) nucleic acids may be present in the submitted sample  additional confirmatory testing may be necessary for epidemiological  and / or clinical management purposes  to differentiate between  SARS-CoV-2 and other Sarbecovirus currently known to infect humans.  If clinically indicated additional testing with an alternate test  methodology [phone]) is advised. The SARS-CoV-2 RNA is generally  detectable in upper and lower respiratory sp ecimens during the acute  phase of infection. The expected result is Negative. Fact Sheet for Patients:  BoilerBrush.com.cy Fact Sheet for Healthcare Providers: https://pope.com/ This test is not yet approved or cleared by the [place] FDA and has been authorized for detection and/or diagnosis of SARS-CoV-2 by FDA under an Emergency Use Authorization (EUA).  This EUA will remain in effect (meaning this test can be used) for the duration of the COVID-19 declaration under Section 564(b)(1) of the Act, 21 U.S.C. section 360bbb-3(b)(1), unless the authorization is terminated or revoked sooner. Performed at [facility], [address], [place], [place] [address]   Blood gas, venous (at [facility] [place] and [facility], not at [facility])     Status: Abnormal   Collection Time: 01/10/19  2:21 PM  Result Value Ref Range   pH,  Ven 7.428 7.250 - 7.430   pCO2, Ven 38.7 (L) 44.0 - 60.0 mmHg   pO2, Ven 66.8 (H) 32.0 - 45.0 mmHg   Bicarbonate 25.1 20.0 - 28.0 mmol/L   Acid-Base Excess 1.4 0.0 - 2.0 mmol/L   O2 Saturation 92.5 %   Patient temperature 98.6    Collection site VEIN    Drawn by [id number]     Sample type VEIN     Comment: Performed at [facility], [address] [place], [place] [address]  Lactate dehydrogenase     Status: Abnormal   Collection Time: 01/10/19  4:00 PM  Result Value Ref Range   LDH 581 (H) 98 - 192 U/L    Comment: Performed at [facility], [address], [place], [place] [address]  Uric acid     Status: None   Collection Time: 01/10/19  4:00 PM  Result Value Ref Range   Uric Acid, Serum 5.8 2.5 - 7.1 mg/dL    Comment: Performed at [facility], [address], [place], [place] [address]  Lactic acid, plasma     Status: None   Collection Time: 01/10/19  4:47 PM  Result Value Ref Range   Lactic Acid, Venous 1.1 0.5 - 1.9 mmol/L    Comment: Performed at [facility], [address] [place], [place] [address]   Dg Chest Portable 1 View  Result Date: 01/10/2019 CLINICAL DATA:  Fever, vomiting, and diarrhea 2 days. Hypoxia. COPD. EXAM: PORTABLE CHEST 1 VIEW COMPARISON:  06/03/2014 FINDINGS: Mild cardiomegaly remains stable. Left lung is clear. Increased opacity is seen in the lateral right lung base, which may be due to infiltrate or atelectasis. No definite pleural effusion visualized. IMPRESSION: 1. Increased opacity in lateral right lung base, which may be due to infiltrate or atelectasis. 2. Stable mild cardiomegaly. Electronically Signed   By: Danae Orleans M.D.   On: 01/10/2019 14:07    Pending Labs Unresulted Labs (From admission, onward)    Start     Ordered   01/10/19 1321  CBC with Differential  Add-on,   AD     01/10/19 1323   01/10/19 1251  Blood Culture (routine x 2)  BLOOD CULTURE X 2,   STAT     01/10/19 1251   01/10/19 1251  Urine culture  ONCE - STAT,   STAT     01/10/19 1251   01/10/19 1152  Urinalysis, Routine w reflex microscopic  ONCE - STAT,   STAT     01/10/19 1152          Vitals/Pain Today's Vitals   01/10/19 1657 01/10/19 1726 01/10/19 1827 01/10/19  1832  BP: (!) 152/54  (!) 161/75 (!) 116/93  Pulse: 73  76 81  Resp: (!) 23  (!) 26 (!) 21  Temp:  98.7 F (37.1 C)    TempSrc:  Oral    SpO2: 96%  98% 100%  Weight:      Height:      PainSc:        Isolation Precautions Airborne and Contact precautions  Medications Medications  sodium chloride flush (NS) 0.9 % injection 3 mL (3 mLs Intravenous Given 01/10/19 1532)  acetaminophen (TYLENOL) tablet 650 mg (650 mg Oral Given 01/10/19 1158)  sodium chloride 0.9 % bolus 500 mL (0 mLs Intravenous Stopped 01/10/19 1646)  potassium chloride SA (K-DUR) CR tablet 40 mEq (40 mEq Oral Given 01/10/19 1557)  ceFEPIme (MAXIPIME) 2 g in  sodium chloride 0.9 % 100 mL IVPB (0 g Intravenous Stopped 01/10/19 1646)  vancomycin (VANCOCIN) 2,000 mg in sodium chloride 0.9 % 500 mL IVPB (2,000 mg Intravenous New Bag/Given 01/10/19 1646)    Mobility walks with device

## 2019-01-10 NOTE — ED Provider Notes (Addendum)
[facility] DEPT Provider Note   CSN: [id number] Arrival date & time: 01/10/19  1124     History   Chief Complaint Chief Complaint  Patient presents with  . Emesis  . Fever    HPI Valerie Beck is a 78 y.o. female.     78 y.o female with a PMH of COPD, GERD, morbid obesity, hypertension, thyroid disease presents to the ED with a chief complaint of diarrhea, fever x 5 days.  Patient reports she began having an episode of diarrhea on Saturday, had multiple episodes on the same day but this later resolved.  She now reports developing a fever on Sunday, T-max of 103 at home, has been taking Tylenol for fever however noted to that her fever was increasing as the days went on.  She also endorses some shortness of breath, reports she does have a history of COPD but she is currently not on any supplemental oxygen.Patient was seen by PCP today and was found to have a temperature 103,   Diarrhea just Saturday  Fever x 4 days....tylenol for fever  Shortness of breathlil no o2 at [facility] 2L O2 today   -chest pain.   The history is provided by the patient and medical records.  Emesis Associated symptoms: cough, diarrhea and fever   Associated symptoms: no abdominal pain and no headaches   Fever Associated symptoms: cough, diarrhea and vomiting   Associated symptoms: no chest pain, no headaches, no nausea and no rhinorrhea     Past Medical History:  Diagnosis Date  . COPD (chronic obstructive pulmonary disease) ([place])   . Dyspnea 03/25/2013   exertion - doesnt take spiriva every day  . Endometrial polyp 07/2005   HYSTEROSCOPY, D&C  . Family history of adverse reaction to anesthesia    SISTER ASPIRATED IN SURGERY 15 YRS AGO  . GERD (gastroesophageal reflux disease)   . History of palpitations    DOESNT FEEL THEM  . History of rectal bleeding    rectal tear related  . Hypertension   . Hypothyroidism   . Kidney insufficiency    WAS TOLD TO WATCH  ANTI-INFLAMMATORIES  . Low O2 saturation 03/08/2016  . Morbid obesity ([place]) 03/25/2013  . Primary localized osteoarthritis of left knee   . Primary localized osteoarthrosis of the knee, right   . Reflux   . 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 Thyroid disease    Hypothyroid  . Urinary incontinence     Patient Active Problem List   Diagnosis Date Noted  . Low O2 saturation 03/08/2016  . Primary localized osteoarthritis of right knee 03/07/2016  . GERD (gastroesophageal reflux disease) 02/24/2016  . Primary localized osteoarthrosis of the knee, right   . Bronchitis, chronic obstructive, with exacerbation ([place]) 06/04/2014  . DJD (degenerative joint disease) of knee 06/02/2014  . 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 Primary localized osteoarthritis of left knee   . COPD (chronic obstructive pulmonary disease) ([place])   . Dyspnea 03/25/2013  . Morbid obesity ([place]) 03/25/2013  . Hypertension   . Reflux   . Thyroid disease   . Endometrial polyp     Past Surgical History:  Procedure Laterality Date  . EYE SURGERY Bilateral    cataracts  . HERNIA REPAIR     X 2  . HYSTEROSCOPY  10/2005   D&C, HYSTEROSCOPY FOR ENDOMETRIAL POLYPS.  Marland Kitchen JOINT REPLACEMENT    . KNEE ARTHROSCOPY  1995  . TONSILLECTOMY    .  TOTAL KNEE ARTHROPLASTY Left 06/02/2014   Procedure: TOTAL KNEE ARTHROPLASTY;  Surgeon: Lorn Junes, MD;  Location: [facility];  Service: Orthopedics;  Laterality: Left;  . TOTAL KNEE ARTHROPLASTY Right 03/07/2016   Procedure: TOTAL KNEE ARTHROPLASTY;  Surgeon: Elsie Saas, MD;  Location: [facility];  Service: Orthopedics;  Laterality: Right;  . TRIGGER FINGER RELEASE     LEFT THEN RIGHT IN 2012     OB History    Gravida  4   Para  3   Term  3   Preterm      AB  1   Living  2     SAB  1   TAB      Ectopic      Multiple      Live Births               Home Medications    Prior to Admission medications   Medication Sig Start Date End Date  Taking? Authorizing Provider  acetaminophen (TYLENOL) 650 MG CR tablet Take 1,300 mg by mouth every 8 (eight) hours as needed for pain.    Yes [provider]  allopurinol (ZYLOPRIM) 300 MG tablet Take 300 mg by mouth daily. 11/05/18  Yes [provider]  Cholecalciferol (VITAMIN D) 2000 UNITS CAPS Take 2,000 Units by mouth daily.   Yes [provider]  furosemide (LASIX) 80 MG tablet Take 40 mg by mouth daily. 11/06/18  Yes [provider]  levothyroxine (SYNTHROID, LEVOTHROID) 100 MCG tablet Take 100 mcg by mouth daily before breakfast.   Yes [provider]  metoprolol succinate (TOPROL-XL) 100 MG 24 hr tablet Take 100 mg by mouth daily. 11/05/18  Yes [provider]  oxybutynin (DITROPAN-XL) 10 MG 24 hr tablet Take 10 mg by mouth every evening. 11/29/18  Yes [provider]  pravastatin (PRAVACHOL) 80 MG tablet Take 80 mg by mouth daily.   Yes [provider]  ranitidine (ZANTAC) 300 MG capsule Take 300 mg by mouth 2 (two) times daily.   Yes [provider]  VITAMIN E PO Take 1 tablet by mouth daily.   Yes [provider]  aspirin EC 325 MG tablet 1 tab a day for the next 30 days to prevent blood clots Patient not taking: Reported on 01/10/2019 03/09/16   Shepperson, Kirstin, PA-C  docusate sodium (COLACE) 100 MG capsule 1 tab 2 times a day while on narcotics.  STOOL SOFTENER Patient not taking: Reported on 01/10/2019 03/09/16   Shepperson, Kirstin, PA-C  mometasone-formoterol (DULERA) 100-5 MCG/ACT AERO Inhale 2 puffs into the lungs 2 (two) times daily. Patient not taking: Reported on 01/10/2019 03/09/16   Matthew Saras, PA-C  oxyCODONE (OXY IR/ROXICODONE) 5 MG immediate release tablet 1-2 tablets every 4-6 hrs as needed for pain Patient not taking: Reported on 01/10/2019 03/09/16   Shepperson, Kirstin, PA-C  polyethylene glycol (MIRALAX / GLYCOLAX) packet 1 packet twice a day until bowels are moving regularly  Patient not taking: Reported on 01/10/2019 03/09/16   Shepperson, Kirstin, PA-C  tiotropium (SPIRIVA HANDIHALER) 18 MCG inhalation capsule Place 1 capsule (18 mcg total) into inhaler and inhale daily. Patient not taking: Reported on 01/10/2019 03/09/16   Matthew Saras, PA-C    Family History Family History  Problem Relation Age of Onset  . Cancer Father        MELANOMA AND THYROID CANCER  . Diabetes Father   . Hypertension Father   . Heart disease Father   . Diabetes Sister   .  Hypertension Mother   . Heart failure Mother   . Hypertension Brother   . Heart disease Brother   . Kidney failure Brother   . Heart attack Brother   . Cancer Brother        Lymphoma    Social History Social History   Tobacco Use  . Smoking status: Never Smoker  . Smokeless tobacco: Never Used  Substance Use Topics  . Alcohol use: No  . Drug use: No     Allergies   Penicillins   Review of Systems Review of Systems  Constitutional: Positive for fever.  HENT: Negative for rhinorrhea and sinus pain.   Eyes: Negative for redness.  Respiratory: Positive for cough and shortness of breath.   Cardiovascular: Negative for chest pain.  Gastrointestinal: Positive for diarrhea and vomiting. Negative for abdominal pain, blood in stool and nausea.  Genitourinary: Negative for flank pain.  Musculoskeletal: Negative for back pain.  Skin: Negative for pallor and wound.  Neurological: Negative for seizures, speech difficulty, weakness, light-headedness and headaches.     Physical Exam Updated Vital Signs BP (!) 148/51   Pulse 64   Temp (!) 102.3 F (39.1 C) (Oral)   Resp (!) 22   Ht '5\' 2"'$  (1.575 m)   Wt 102.1 kg   SpO2 97%   BMI 41.15 kg/m   Physical Exam   ED Treatments / Results  Labs (all labs ordered are listed, but only abnormal results are displayed) Labs Reviewed  COMPREHENSIVE METABOLIC PANEL - Abnormal; Notable for the following components:      Result Value   Sodium 131  (*)    Potassium 3.0 (*)    Chloride 90 (*)    Glucose, Bld 143 (*)    BUN 25 (*)    Creatinine, Ser 1.88 (*)    Calcium 8.8 (*)    Albumin 3.1 (*)    AST 43 (*)    GFR calc non Af Amer 25 (*)    GFR calc Af Amer 30 (*)    Anion gap 17 (*)    All other components within normal limits  CBC - Abnormal; Notable for the following components:   WBC 1.2 (*)    RBC 3.55 (*)    Hemoglobin 11.0 (*)    HCT 33.0 (*)    RDW 15.9 (*)    All other components within normal limits  APTT - Abnormal; Notable for the following components:   aPTT 38 (*)    All other components within normal limits  PROTIME-INR - Abnormal; Notable for the following components:   Prothrombin Time 17.8 (*)    INR 1.5 (*)    All other components within normal limits  BLOOD GAS, VENOUS - Abnormal; Notable for the following components:   pCO2, Ven 38.7 (*)    pO2, Ven 66.8 (*)    All other components within normal limits  DIFFERENTIAL - Abnormal; Notable for the following components:   Neutro Abs 0.5 (*)    Lymphs Abs 0.3 (*)    Monocytes Absolute 0.0 (*)    All other components within normal limits  SARS CORONAVIRUS 2 (HOSPITAL ORDER, [facility])  CULTURE, BLOOD (ROUTINE X 2)  CULTURE, BLOOD (ROUTINE X 2)  URINE CULTURE  LIPASE, BLOOD  LACTIC ACID, PLASMA  PATHOLOGIST SMEAR REVIEW  URINALYSIS, ROUTINE W REFLEX MICROSCOPIC  LACTIC ACID, PLASMA  CBC WITH DIFFERENTIAL/PLATELET  LACTATE DEHYDROGENASE  URIC ACID    EKG EKG Interpretation  Date/Time:  Thursday January 10 2019 13:20:57 EDT Ventricular Rate:  71 PR Interval:    QRS Duration: 93 QT Interval:  393 QTC Calculation: 428 R Axis:   30 Text Interpretation:  Sinus or ectopic atrial rhythm Atrial premature complex Short PR interval Abnormal inferior Q waves Borderline repolarization abnormality Baseline wander in lead(s) II III aVF V5 V6 Confirmed by Dene Gentry [phone]) on 01/10/2019 1:41:22 PM   Radiology Dg  Chest Portable 1 View  Result Date: 01/10/2019 CLINICAL DATA:  Fever, vomiting, and diarrhea 2 days. Hypoxia. COPD. EXAM: PORTABLE CHEST 1 VIEW COMPARISON:  06/03/2014 FINDINGS: Mild cardiomegaly remains stable. Left lung is clear. Increased opacity is seen in the lateral right lung base, which may be due to infiltrate or atelectasis. No definite pleural effusion visualized. IMPRESSION: 1. Increased opacity in lateral right lung base, which may be due to infiltrate or atelectasis. 2. Stable mild cardiomegaly. Electronically Signed   By: Marlaine Hind M.D.   On: 01/10/2019 14:07    Procedures .Critical Care Performed by: Janeece Fitting, PA-C Authorized by: Janeece Fitting, PA-C   Critical care provider statement:    Critical care time (minutes):  45   Critical care start time:  01/10/2019 2:30 PM   Critical care end time:  01/10/2019 3:15 PM   Critical care time was exclusive of:  Separately billable procedures and treating other patients   Critical care was necessary to treat or prevent imminent or life-threatening deterioration of the following conditions:  Sepsis   Critical care was time spent personally by me on the following activities:  Blood draw for specimens, development of treatment plan with patient or surrogate, discussions with consultants, evaluation of patient's response to treatment, examination of patient, obtaining history from patient or surrogate, ordering and performing treatments and interventions, ordering and review of laboratory studies, ordering and review of radiographic studies, pulse oximetry, re-evaluation of patient's condition and review of old charts   (including critical care time)  Medications Ordered in ED Medications  ceFEPIme (MAXIPIME) 2 g in sodium chloride 0.9 % 100 mL IVPB (2 g Intravenous New Bag/Given 01/10/19 1556)  vancomycin (VANCOCIN) 2,000 mg in sodium chloride 0.9 % 500 mL IVPB (has no administration in time range)  sodium chloride flush (NS) 0.9 %  injection 3 mL (3 mLs Intravenous Given 01/10/19 1532)  acetaminophen (TYLENOL) tablet 650 mg (650 mg Oral Given 01/10/19 1158)  sodium chloride 0.9 % bolus 500 mL (500 mLs Intravenous New Bag/Given 01/10/19 1321)  potassium chloride SA (K-DUR) CR tablet 40 mEq (40 mEq Oral Given 01/10/19 1557)     Initial Impression / Assessment and Plan / ED Course  I have reviewed the triage vital signs and the nursing notes.  Pertinent labs & imaging results that were available during my care of the patient were reviewed by me and considered in my medical decision making (see chart for details).     Patient with a past medical history of COPD presents to the ED with diarrhea, fevers for the past 4 days.  Patient reports having episodes of diarrhea on Saturday, she then began running fevers, fevers have been significantly more elevated as the days have gone by, she has been taking Tylenol for her symptoms without improvement.  On today's evaluation patient is having new oxygen requirement, arrived in the ED satting at 89% on room air, was placed on 2 L of O2 which brought her up to 98%.  Patient's temperature on arrival was 102.3. She was seen by PCP today  who obtain a Kopit test which has not resulted yet.  Some suspicion for Kopit due to her symptoms and signs.  CMP shows slight hyponatremia, mild hypokalemia at 3.0 will orally replace this at this time.  Creatinine level slightly elevated but consistent with her previous visits.  AST is slightly elevated, she denies any abdominal pain on today's visit.  CBC was remarkable for neutropenia with a white blood cell count of 1.2, hemoglobin is decreased at 11.0.  Chest x-ray revealed: 1. Increased opacity in lateral right lung base, which may be due to  infiltrate or atelectasis.  2. Stable mild cardiomegaly.     Code sepsis was activated.  2:42 PM Spoke to Dr. 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  Will place call for oncology further recommendations.   2:46 PM Spoke to Dr. Jana Hakim from heme/onc at [facility] who advised patient will need transfer to [facility] as they  Heme fellow on call and transfer   3:25 PM Spoke to Dr. Lissa Merlin, leslie at [facility] with [facility] patient will need Covid 19 testing prior to transfer. Will add on additional labs such as uric acid, LDH onto lab work.   Covid test is negative. Patient has been informed of results with husband at the bedside, she is agreeable to transfer at this time.  Portions of this note were generated with [employer]. Dictation errors may occur despite best attempts at proofreading.  Final Clinical Impressions(s) / ED Diagnoses   Final diagnoses:  Fever, unspecified fever cause  Diarrhea, unspecified type  Neutropenia, unspecified type [facility])    ED Discharge Orders    None       Janeece Fitting, PA-C 01/10/19 1602    Janeece Fitting, PA-C 01/10/19 1603    Valarie Merino, MD 01/10/19 2240

## 2019-01-10 NOTE — ED Notes (Signed)
Report given to [facility] 

## 2019-01-15 LAB — CULTURE, BLOOD (ROUTINE X 2)
Culture: NO GROWTH
Culture: NO GROWTH
Special Requests: ADEQUATE
Special Requests: ADEQUATE

## 2019-01-23 ENCOUNTER — Ambulatory Visit: Payer: Medicare Other

## 2019-01-31 DEATH — deceased

## 2020-05-15 IMAGING — MG DIGITAL SCREENING BILATERAL MAMMOGRAM WITH TOMO AND CAD
8 series · 8 of 24 positions shown · non-contrast
Comparison: Previous exam(s).

CLINICAL DATA: Screening.

EXAM:
DIGITAL SCREENING BILATERAL MAMMOGRAM WITH TOMO AND CAD

[R CC synth-2D]
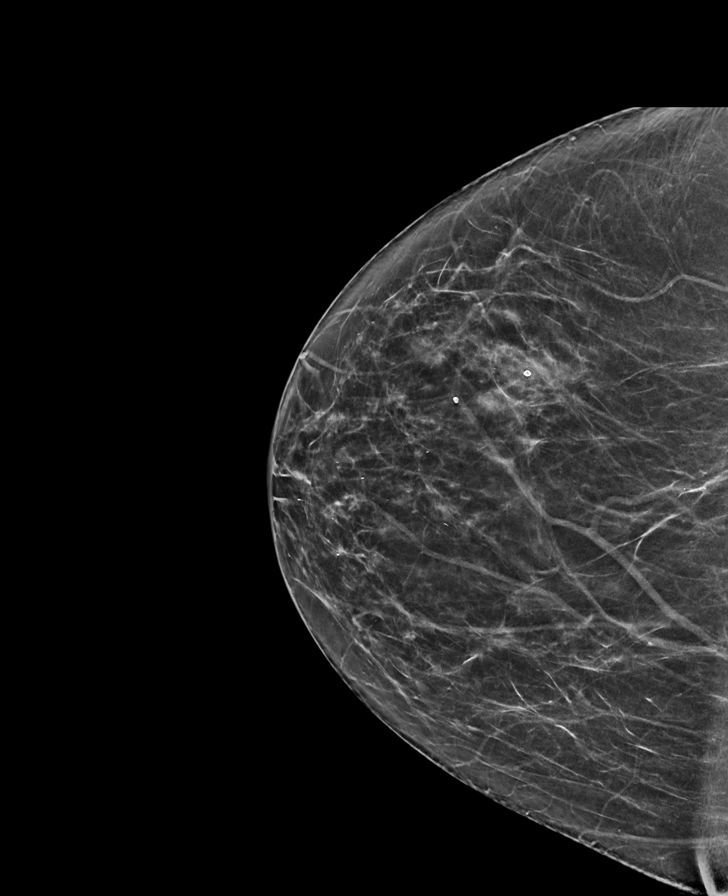

[R MLO synth-2D]
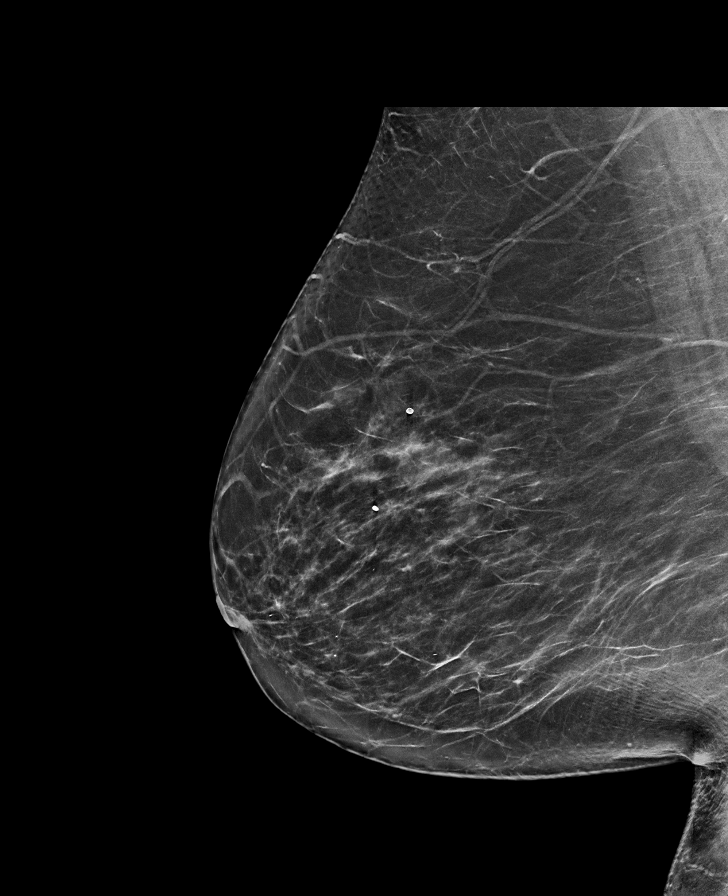

[L MLO synth-2D]
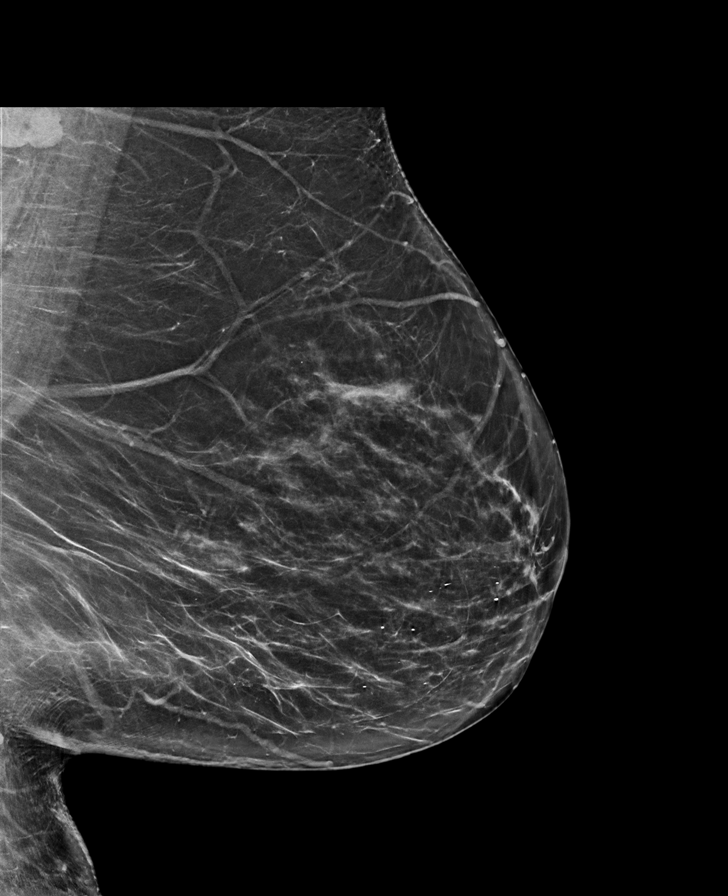

[L CC synth-2D]
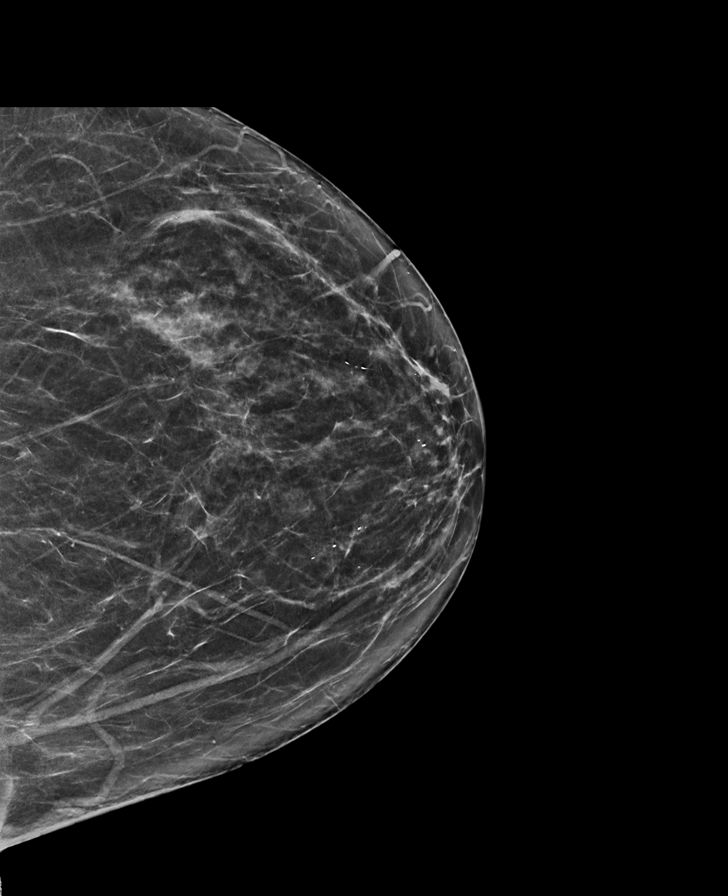

[R MLO tomo · tomo slice 42/83.0]
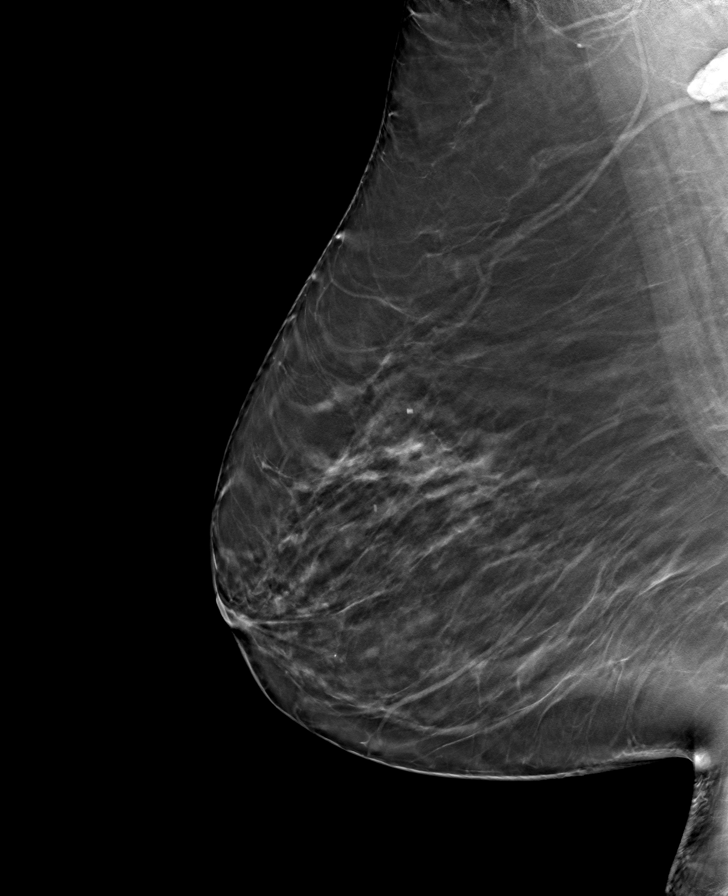

[L CC tomo · tomo slice 35/69.0]
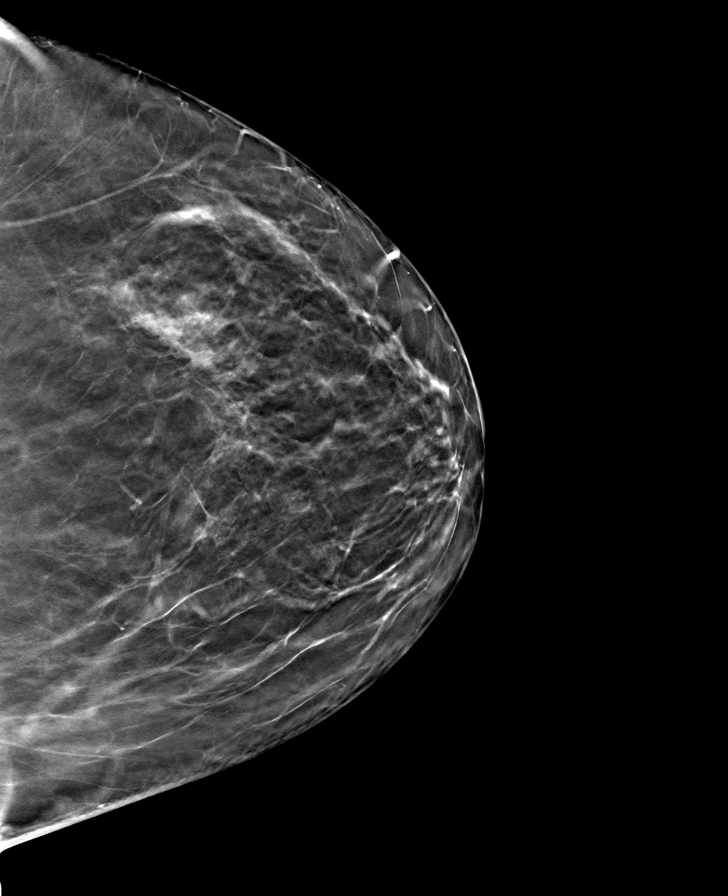

[L MLO tomo · tomo slice 41/82.0]
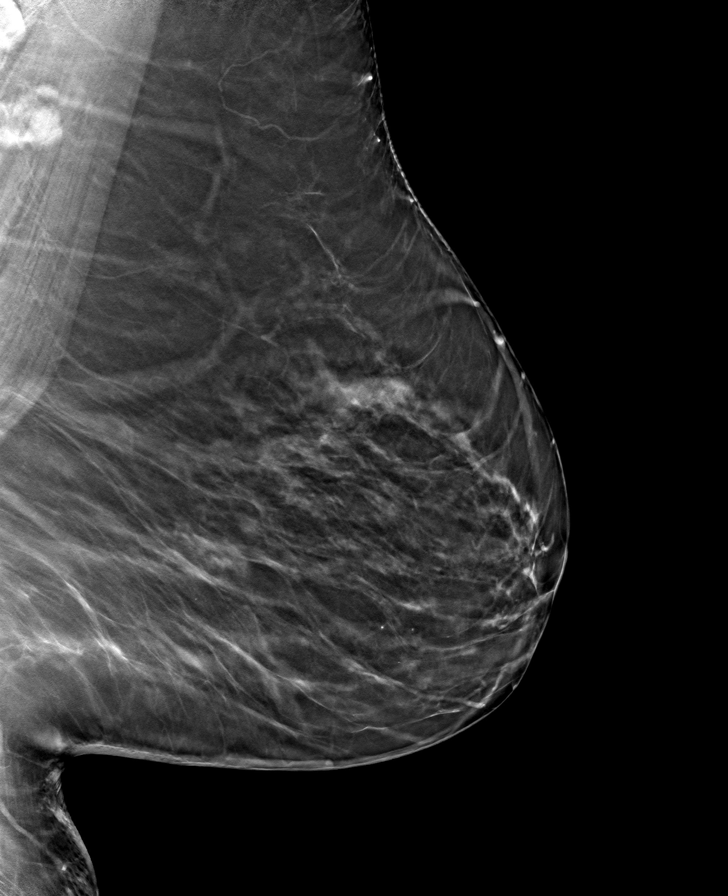

[R CC tomo · tomo slice 35/68.0]
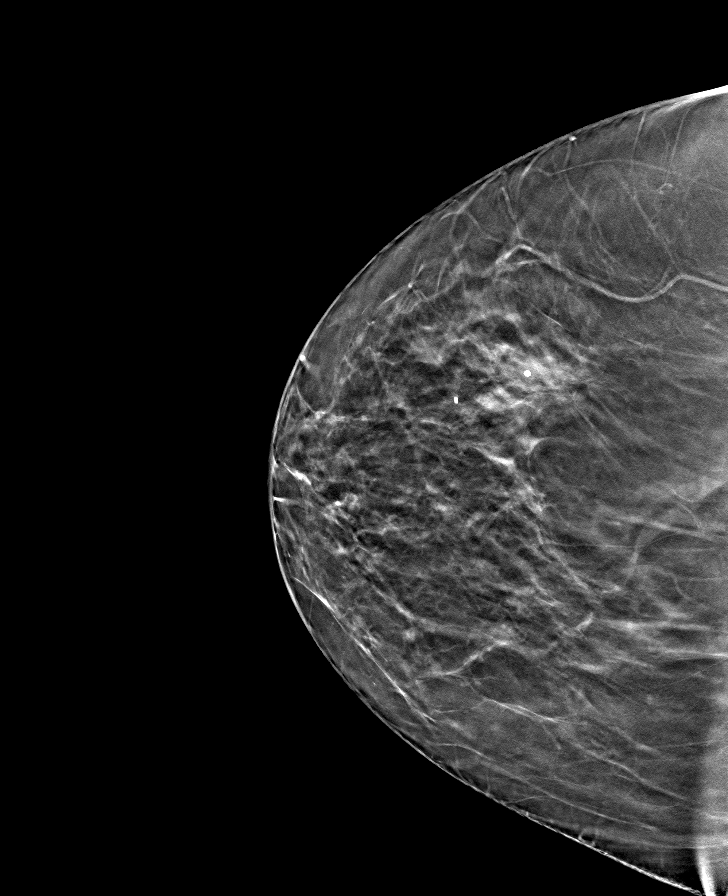

[8 of 24 positions shown; findings below may reference images not displayed]

ACR Breast Density Category b: There are scattered areas of
fibroglandular density.
FINDINGS: There are no findings suspicious for malignancy. Images were
processed with CAD.
IMPRESSION: No mammographic evidence of malignancy. A result letter of this
screening mammogram will be mailed directly to the patient.

RECOMMENDATION:
Screening mammogram in one year. (Code:CN-[id number])

BI-RADS CATEGORY  1: Negative.

## 2021-05-28 IMAGING — DX DG CHEST 1V PORT
1 series · 1 of 1 positions shown · non-contrast
Comparison: 06/03/2014

CLINICAL DATA: Fever, vomiting, and diarrhea 2 days. Hypoxia. COPD.

EXAM:
PORTABLE CHEST 1 VIEW

[chest ap]
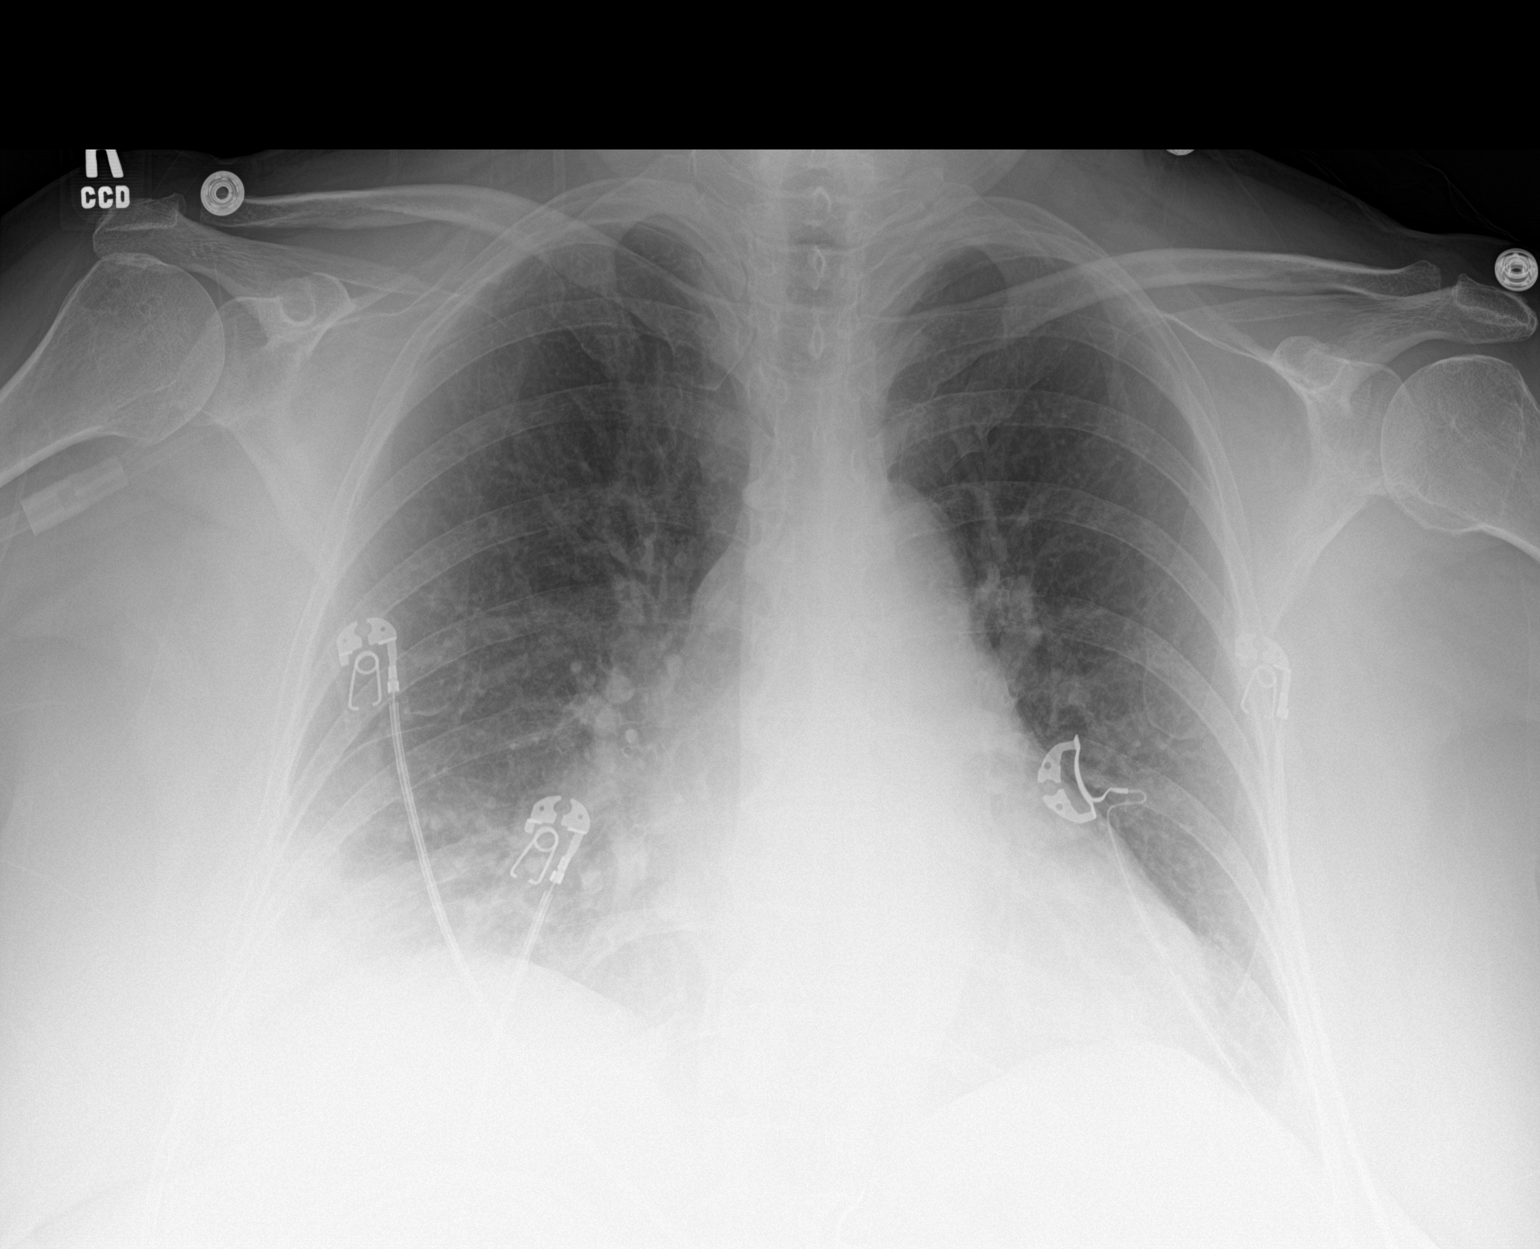

[1 of 1 positions shown; findings below may reference images not displayed]

FINDINGS: Mild cardiomegaly remains stable. Left lung is clear. Increased
opacity is seen in the lateral right lung base, which may be due to
infiltrate or atelectasis. No definite pleural effusion visualized.
IMPRESSION: 1. Increased opacity in lateral right lung base, which may be due to
infiltrate or atelectasis.
2. Stable mild cardiomegaly.
